# Patient Record
Sex: Male | Born: 1995 | Race: Black or African American | Hispanic: No | Marital: Single | Smoking: Current every day smoker
Health system: Southern US, Community
[De-identification: ages and names within clinical notes are randomized; demographics above are authoritative.]

---
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
## 2015-10-30 ENCOUNTER — Encounter (HOSPITAL_COMMUNITY): Payer: Self-pay | Admitting: Emergency Medicine

## 2015-10-30 ENCOUNTER — Emergency Department (HOSPITAL_COMMUNITY)
Admission: EM | Admit: 2015-10-30 | Discharge: 2015-10-30 | Disposition: A | Discharge status: Home / Self Care | Payer: Commercial Managed Care - HMO | Attending: Emergency Medicine | Admitting: Emergency Medicine

## 2015-10-30 DIAGNOSIS — R112 Nausea with vomiting, unspecified: Secondary | ICD-10-CM | POA: Diagnosis present

## 2015-10-30 DIAGNOSIS — K529 Noninfective gastroenteritis and colitis, unspecified: Secondary | ICD-10-CM | POA: Insufficient documentation

## 2015-10-30 DIAGNOSIS — F17200 Nicotine dependence, unspecified, uncomplicated: Secondary | ICD-10-CM | POA: Insufficient documentation

## 2015-10-30 DIAGNOSIS — Z79899 Other long term (current) drug therapy: Secondary | ICD-10-CM | POA: Insufficient documentation

## 2015-10-30 DIAGNOSIS — A084 Viral intestinal infection, unspecified: Secondary | ICD-10-CM | POA: Insufficient documentation

## 2015-10-30 LAB — URINALYSIS, ROUTINE W REFLEX MICROSCOPIC
Bilirubin Urine: NEGATIVE
GLUCOSE, UA: NEGATIVE mg/dL
HGB URINE DIPSTICK: NEGATIVE
Ketones, ur: NEGATIVE mg/dL
Nitrite: NEGATIVE
Protein, ur: NEGATIVE mg/dL
SPECIFIC GRAVITY, URINE: 1.037 — AB (ref 1.005–1.030)
pH: 6 (ref 5.0–8.0)

## 2015-10-30 LAB — I-STAT CHEM 8, ED
BUN: 14 mg/dL (ref 6–20)
CALCIUM ION: 1.12 mmol/L (ref 1.12–1.23)
CHLORIDE: 100 mmol/L — AB (ref 101–111)
Creatinine, Ser: 1 mg/dL (ref 0.61–1.24)
GLUCOSE: 85 mg/dL (ref 65–99)
HCT: 48 % (ref 39.0–52.0)
Hemoglobin: 16.3 g/dL (ref 13.0–17.0)
Potassium: 3.4 mmol/L — ABNORMAL LOW (ref 3.5–5.1)
Sodium: 137 mmol/L (ref 135–145)
TCO2: 26 mmol/L (ref 0–100)

## 2015-10-30 LAB — URINE MICROSCOPIC-ADD ON
RBC / HPF: NONE SEEN RBC/hpf (ref 0–5)
Squamous Epithelial / LPF: NONE SEEN

## 2015-10-30 LAB — HIV ANTIBODY (ROUTINE TESTING W REFLEX): HIV Screen 4th Generation wRfx: NONREACTIVE

## 2015-10-30 LAB — I-STAT CG4 LACTIC ACID, ED: LACTIC ACID, VENOUS: 0.87 mmol/L (ref 0.5–2.0)

## 2015-10-30 MED ORDER — AZITHROMYCIN 250 MG PO TABS
1000.0000 mg | ORAL_TABLET | Freq: Once | ORAL | Status: AC
  Administered 2015-10-30: 1000 mg via ORAL
  Filled 2015-10-30: qty 4

## 2015-10-30 MED ORDER — ONDANSETRON 8 MG PO TBDP: 8.0000 mg | ORAL_TABLET | Freq: Three times a day (TID) | ORAL | Status: DC | PRN

## 2015-10-30 MED ORDER — CEFTRIAXONE SODIUM 250 MG IJ SOLR
250.0000 mg | Freq: Once | INTRAMUSCULAR | Status: AC
  Administered 2015-10-30: 250 mg via INTRAMUSCULAR
  Filled 2015-10-30: qty 250

## 2015-10-30 MED ORDER — LOPERAMIDE HCL 2 MG PO CAPS: 2.0000 mg | ORAL_CAPSULE | Freq: Four times a day (QID) | ORAL | Status: DC | PRN

## 2015-10-30 MED ORDER — MAGNESIUM CHLORIDE 64 MG PO TBEC
1.0000 | DELAYED_RELEASE_TABLET | Freq: Once | ORAL | Status: AC
  Administered 2015-10-30: 64 mg via ORAL
  Filled 2015-10-30: qty 1

## 2015-10-30 MED ORDER — ONDANSETRON 4 MG PO TBDP
4.0000 mg | ORAL_TABLET | Freq: Once | ORAL | Status: AC
  Administered 2015-10-30: 4 mg via ORAL
  Filled 2015-10-30: qty 1

## 2015-10-30 MED ORDER — POTASSIUM CHLORIDE CRYS ER 20 MEQ PO TBCR
40.0000 meq | EXTENDED_RELEASE_TABLET | Freq: Once | ORAL | Status: AC
  Administered 2015-10-30: 40 meq via ORAL
  Filled 2015-10-30: qty 2

## 2015-10-30 MED ORDER — LIDOCAINE HCL (PF) 1 % IJ SOLN
INTRAMUSCULAR | Status: AC
  Administered 2015-10-30: 2 mL
  Filled 2015-10-30: qty 5

## 2015-10-30 NOTE — ED Provider Notes (Signed)
CSN: [id number]     Arrival date & time 10/30/15  0320 History  By signing my name below, I, Terrance Branch, attest that this documentation has been prepared under the direction and in the presence of Derwood KaplanAnkit Jemaine Prokop, MD. Electronically Signed: Evon Slackerrance Branch, ED Scribe. 10/30/2015. 6:44 AM.    Chief Complaint  Patient presents with  . Nausea  . Emesis  . Diarrhea    Patient is a 19 y.o. male presenting with vomiting and diarrhea. The history is provided by the patient. No language interpreter was used.  Emesis Associated symptoms: abdominal pain and diarrhea   Diarrhea Associated symptoms: abdominal pain and vomiting   Associated symptoms: no fever    HPI Comments: Douglas Kline is a 19 y.o. male who presents to the Emergency Department complaining of new abdominal pain onset 4 days prior. Pt states that he has associated nausea, vomiting and diarrhea. Pt reports watery stool almost every hour. Pt states that yesterday his diarrhea was at its worse, he states that during the night he would wake up in the middle of the night after defecating on himself. Pt reports being around his mom and sister who has had similar symptoms. Pt doesn't report any suspect food intake. Pt denies blood in his stool, fever, dizziness, light headedness or rash. Pt denies any recent travel.   History reviewed. No pertinent past medical history. History reviewed. No pertinent past surgical history. History reviewed. No pertinent family history. Social History  Substance Use Topics  . Smoking status: Current Every Day Smoker  . Smokeless tobacco: None  . Alcohol Use: No    Review of Systems  Constitutional: Negative for fever.  Gastrointestinal: Positive for nausea, vomiting, abdominal pain and diarrhea. Negative for blood in stool.  Neurological: Negative for dizziness and light-headedness.    ROS 10 Systems reviewed and are negative for acute change except as noted in the HPI.     Allergies   Morphine and related  Home Medications   Prior to Admission medications   Medication Sig Start Date End Date Taking? Authorizing Provider  acetaminophen (TYLENOL) 500 MG tablet Take 1,000 mg by mouth every 6 (six) hours as needed for moderate pain.   Yes Historical Provider, MD  bismuth subsalicylate (PEPTO BISMOL) 262 MG chewable tablet Chew 1,048 mg by mouth 3 (three) times daily as needed for indigestion.   Yes Historical Provider, MD  loperamide (IMODIUM) 2 MG capsule Take 1 capsule (2 mg total) by mouth 4 (four) times daily as needed for diarrhea or loose stools. 10/30/15   Derwood KaplanAnkit Jacqueline Spofford, MD  ondansetron (ZOFRAN ODT) 8 MG disintegrating tablet Take 1 tablet (8 mg total) by mouth every 8 (eight) hours as needed for nausea. 10/30/15   Flo Berroa, MD   BP 130/76 mmHg  Pulse 68  Temp(Src) 98.5 F (36.9 C) (Oral)  Resp 18  SpO2 99%   Physical Exam  Constitutional: He is oriented to person, place, and time. He appears well-developed and well-nourished. No distress.  HENT:  Head: Normocephalic and atraumatic.  Mouth/Throat: Mucous membranes are normal.  Mucous membranes moist.   Eyes: Conjunctivae and EOM are normal.  Neck: Neck supple. No tracheal deviation present.  Cardiovascular: Normal rate and regular rhythm.   Pulmonary/Chest: Effort normal and breath sounds normal. No respiratory distress.  Lungs clear to auscultation.   Abdominal: There is tenderness in the periumbilical area.  Periumbilical tenderness without peritoneal signs.   Musculoskeletal: Normal range of motion.  Neurological: He is alert and  oriented to person, place, and time.  Skin: Skin is warm and dry.  Psychiatric: He has a normal mood and affect. His behavior is normal.  Nursing note and vitals reviewed.   ED Course  Procedures (including critical care time) DIAGNOSTIC STUDIES: Oxygen Saturation is 99% on RA, normal by my interpretation.    COORDINATION OF CARE: 3:47 AM-Discussed treatment plan  with pt at bedside and pt agreed to plan.   :20 am: Pt alerts [place] that he had some penile discharge last month, which resolved, but then he had some discomfort with urination today. He admits to unprotected intercourse with 1 partner. Would prefer GC and Chlamydia treatment. Would prefer HIV screen Oral challenge has been initiated.   Labs Review Labs Reviewed  URINALYSIS, ROUTINE W REFLEX MICROSCOPIC (NOT AT [facility]) - Abnormal; Notable for the following:    Color, Urine AMBER (*)    APPearance CLOUDY (*)    Specific Gravity, Urine 1.037 (*)    Leukocytes, UA TRACE (*)    All other components within normal limits  URINE MICROSCOPIC-ADD ON - Abnormal; Notable for the following:    Bacteria, UA RARE (*)    Crystals CA OXALATE CRYSTALS (*)    All other components within normal limits  I-STAT CHEM 8, ED - Abnormal; Notable for the following:    Potassium 3.4 (*)    Chloride 100 (*)    All other components within normal limits  HIV ANTIBODY (ROUTINE TESTING)  I-STAT CG4 LACTIC ACID, ED  GC/CHLAMYDIA PROBE AMP ([employer]) NOT AT [facility]    Imaging Review No results found.    EKG Interpretation None      MDM   Final diagnoses:  Viral gastroenteritis  Colitis     I personally performed the services described in this documentation, which was scribed in my presence. The recorded information has been reviewed and is accurate.   Pt comes in with cc of abd pain, n/v/d. Diarrhea is moderately severe, although seems like he did better today compared to yday. Abd pain is worse today. Pain is still non focal and non peritoneal.  Will get hydration going. With 10+ episodes of emesis, requested RN to order chem 8.   :30 pm: PO challenge passed.   Derwood Kaplan, MD 10/30/15 [phone]

## 2015-10-30 NOTE — ED Notes (Signed)
Patient here with complaints of abd pain, nausea, vomiting, dirrhea x3 days.

## 2015-10-30 NOTE — Discharge Instructions (Signed)
Clear Liquid Diet A clear liquid diet is a short-term diet that is prescribed to provide the necessary fluid and basic energy you need when you can have nothing else. The clear liquid diet consists of liquids or solids that will become liquid at room temperature. You should be able to see through the liquid. There are many reasons that you may be restricted to clear liquids, such as:  When you have a sudden-onset (acute) condition that occurs before or after surgery.  To help your body slowly get adjusted to food again after a long period when you were unable to have food.  Replacement of fluids when you have a diarrheal disease.  When you are going to have certain exams, such as a colonoscopy, in which instruments are inserted inside your body to look at parts of your digestive system. WHAT CAN I HAVE? A clear liquid diet does not provide all the nutrients you need. It is important to choose a variety of the following items to get as many nutrients as possible:  Vegetable juices that do not have pulp.  Fruit juices and fruit drinks that do not have pulp.  Coffee (regular or decaffeinated), tea, or soda at the discretion of your health care provider.  Clear bouillon, broth, or strained broth-based soups.  High-protein and flavored gelatins.  Sugar or honey.  Ices or frozen ice pops that do not contain milk. If you are not sure whether you can have certain items, you should ask your health care provider. You may also ask your health care provider if there are any other clear liquid options.   This information is not intended to replace advice given to you by your health care provider. Make sure you discuss any questions you have with your health care provider.   Document Released: 10/30/2005 Document Revised: 11/04/2013 Document Reviewed: 09/26/2013 Elsevier Interactive Patient Education 2016 [employer]    Viral Gastroenteritis Viral gastroenteritis is also called stomach flu.  This illness is caused by a certain type of germ (virus). It can cause sudden watery poop (diarrhea) and throwing up (vomiting). This can cause you to lose body fluids (dehydration). This illness usually lasts for 3 to 8 days. It usually goes away on its own. HOME CARE   Drink enough fluids to keep your pee (urine) clear or pale yellow. Drink small amounts of fluids often.  Ask your doctor how to replace body fluid losses (rehydration).  Avoid:  Foods high in sugar.  Alcohol.  Bubbly (carbonated) drinks.  Tobacco.  Juice.  Caffeine drinks.  Very hot or cold fluids.  Fatty, greasy foods.  Eating too much at one time.  Dairy products until 24 to 48 hours after your watery poop stops.  You may eat foods with active cultures (probiotics). They can be found in some yogurts and supplements.  Wash your hands well to avoid spreading the illness.  Only take medicines as told by your doctor. Do not give aspirin to children. Do not take medicines for watery poop (antidiarrheals).  Ask your doctor if you should keep taking your regular medicines.  Keep all doctor visits as told. GET HELP RIGHT AWAY IF:   You cannot keep fluids down.  You do not pee at least once every 6 to 8 hours.  You are short of breath.  You see blood in your poop or throw up. This may look like coffee grounds.  You have belly (abdominal) pain that gets worse or is just in one small spot (  localized).  You keep throwing up or having watery poop.  You have a fever.  The patient is a child younger than 3 months, and he or she has a fever.  The patient is a child older than 3 months, and he or she has a fever and problems that do not go away.  The patient is a child older than 3 months, and he or she has a fever and problems that suddenly get worse.  The patient is a baby, and he or she has no tears when crying. MAKE SURE YOU:   Understand these instructions.  Will watch your condition.  Will  get help right away if you are not doing well or get worse.   This information is not intended to replace advice given to you by your health care provider. Make sure you discuss any questions you have with your health care provider.   Document Released: 04/17/2008 Document Revised: 01/22/2012 Document Reviewed: 08/16/2011 Elsevier Interactive Patient Education [employer] Else[employer] Inc.

## 2015-11-01 LAB — GC/CHLAMYDIA PROBE AMP (~~LOC~~) NOT AT ARMC
Chlamydia: POSITIVE — AB
NEISSERIA GONORRHEA: NEGATIVE

## 2015-11-02 ENCOUNTER — Telehealth: Payer: Self-pay | Admitting: Emergency Medicine

## 2015-11-02 ENCOUNTER — Telehealth (HOSPITAL_BASED_OUTPATIENT_CLINIC_OR_DEPARTMENT_OTHER): Payer: Self-pay | Admitting: Emergency Medicine

## 2015-11-03 ENCOUNTER — Telehealth (HOSPITAL_COMMUNITY): Payer: Self-pay

## 2015-11-03 NOTE — Telephone Encounter (Signed)
Pt ID verified x 2.  Pt informed of dx (chlamydia), tx rcvd approp., notify partner and abstain from sex x 2 weeks from tx date.

## 2020-01-19 ENCOUNTER — Emergency Department (HOSPITAL_BASED_OUTPATIENT_CLINIC_OR_DEPARTMENT_OTHER)
Admission: EM | Admit: 2020-01-19 | Discharge: 2020-01-19 | Discharge status: Against Medical Advice | Payer: 59 | Attending: Emergency Medicine | Admitting: Emergency Medicine

## 2020-01-19 ENCOUNTER — Other Ambulatory Visit: Payer: Self-pay

## 2020-01-19 ENCOUNTER — Encounter (HOSPITAL_BASED_OUTPATIENT_CLINIC_OR_DEPARTMENT_OTHER): Payer: Self-pay | Admitting: *Deleted

## 2020-01-19 DIAGNOSIS — Z5321 Procedure and treatment not carried out due to patient leaving prior to being seen by health care provider: Secondary | ICD-10-CM | POA: Diagnosis not present

## 2020-01-19 DIAGNOSIS — Z202 Contact with and (suspected) exposure to infections with a predominantly sexual mode of transmission: Secondary | ICD-10-CM | POA: Diagnosis present

## 2020-01-19 NOTE — ED Notes (Signed)
Called for room no answer

## 2020-01-19 NOTE — ED Notes (Signed)
Called for room with no answer.  °

## 2020-01-19 NOTE — ED Triage Notes (Signed)
Possible STD exposure. He has blisters on his penis.

## 2020-01-21 LAB — GC/CHLAMYDIA PROBE AMP (~~LOC~~) NOT AT ARMC
Chlamydia: NEGATIVE
Neisseria Gonorrhea: NEGATIVE

## 2020-09-06 ENCOUNTER — Other Ambulatory Visit: Payer: Self-pay

## 2020-09-06 ENCOUNTER — Emergency Department (HOSPITAL_COMMUNITY)
Admission: EM | Admit: 2020-09-06 | Discharge: 2020-09-06 | Disposition: A | Discharge status: Home / Self Care | Payer: 59 | Attending: Emergency Medicine | Admitting: Emergency Medicine

## 2020-09-06 ENCOUNTER — Encounter (HOSPITAL_COMMUNITY): Payer: Self-pay

## 2020-09-06 DIAGNOSIS — R369 Urethral discharge, unspecified: Secondary | ICD-10-CM | POA: Diagnosis present

## 2020-09-06 DIAGNOSIS — N4889 Other specified disorders of penis: Secondary | ICD-10-CM | POA: Insufficient documentation

## 2020-09-06 DIAGNOSIS — F17290 Nicotine dependence, other tobacco product, uncomplicated: Secondary | ICD-10-CM | POA: Diagnosis not present

## 2020-09-06 LAB — URINALYSIS, ROUTINE W REFLEX MICROSCOPIC
Bilirubin Urine: NEGATIVE
Glucose, UA: NEGATIVE mg/dL
Hgb urine dipstick: NEGATIVE
Ketones, ur: NEGATIVE mg/dL
Nitrite: NEGATIVE
Protein, ur: NEGATIVE mg/dL
Specific Gravity, Urine: 1.017 (ref 1.005–1.030)
WBC, UA: >50 WBC/hpf — ABNORMAL HIGH (ref 0–5)
pH: 7 (ref 5.0–8.0)

## 2020-09-06 MED ORDER — CEFTRIAXONE SODIUM 1 G IJ SOLR
500.0000 mg | Freq: Once | INTRAMUSCULAR | Status: AC
  Administered 2020-09-06: 500 mg via INTRAMUSCULAR
  Filled 2020-09-06: qty 10

## 2020-09-06 MED ORDER — STERILE WATER FOR INJECTION IJ SOLN
INTRAMUSCULAR | Status: AC
  Administered 2020-09-06: 10 mL
  Filled 2020-09-06: qty 10

## 2020-09-06 MED ORDER — DOXYCYCLINE HYCLATE 100 MG PO CAPS: 100.0000 mg | ORAL_CAPSULE | Freq: Two times a day (BID) | ORAL | 0 refills | Status: AC

## 2020-09-06 MED ORDER — DOXYCYCLINE HYCLATE 100 MG PO TABS
100.0000 mg | ORAL_TABLET | Freq: Once | ORAL | Status: AC
  Administered 2020-09-06: 100 mg via ORAL
  Filled 2020-09-06: qty 1

## 2020-09-06 NOTE — Discharge Instructions (Signed)
Please take a few minutes to read these important discharge instructions below: You have been diagnosed with and treated for a presumed sexually transmitted infection such as chlamydia, gonorrhea, or trichomoniasis.  Chlamydia and gonorrhea are the most commonly reported communicable diseases in the [place]. They are especially common among people younger than 25 years of age. Infection without symptoms is common in men and women. Your partner may be infected but not display symptoms.  Complications of these infections without treatment include serious pelvic infections, ectopic pregnancy, and infertility. Other infections such as HIV or HPV, which can occur in patients with sexually transmitted infections, can lead to cancer or death. To minimize the spread of the infection, you should avoid sexual intercourse for 7 days or until symptoms have resolved.  To minimize the spread of the infection, you should advise your sexual partner(s) to be evaluated, tested and treated. This includes all sexual partners within the past 60 days or your last sexual partner if last contact was greater than 60 days.  To minimize the risk of reinfection, you should abstain from sexual intercourse until your sexual partners have been tested and treated.  Consistent condom use is important in preventing the spread of sexually transmitted infections.  Do you need to get re-tested? Studies reveal that infection with chlamydia or gonorrhea is common among those treated within the preceding several months. Testing for gonorrhea and chlamydia in approximately 3 months is recommended even if you believe your sexual partner has been treated. Based on your history or demographics, your doctor may feel that you are higher risk for HIV and syphilis.  Talk to your doctor about whether you should be tested for these diseases at this time.  When should you come back to the Emergency Department? If your symptoms have not  improved after 48 hours from when you received your antibiotics, you should contact your primary care doctor or return to the Emergency Department.  These may be signs of an infection that has not been fully treated. If you develop worsening pain, worsening discharge from your penis or vagina, fevers of greater than 100.4 F, or have any other new or alarming symptoms, you should return promptly to the Emergency Department.  You should follow up with your primary care doctor's office in 1 week to ensure that your symptoms have resolved.  

## 2020-09-06 NOTE — ED Triage Notes (Signed)
Patient c/o right groin and testicle swelling x 2 days. patient states he noticed it after working out.

## 2020-09-06 NOTE — ED Provider Notes (Signed)
[facility]PT Provider Note   CSN: [id number] Arrival date & time: 09/06/20  1009     History CC: Penile drainage   Douglas Kline is a 24 y.o. male presented to emergency department with penile drainage.  The patient reports that he is noted white discharge from the penis for the past 2 to 3 days.  He said that he felt like his testicles were "feeling weird" but denies any active pain in his testicles.  He denies any swelling or trauma to the testicles.  He says he saw his PCP for similar symptoms several months ago, was told that he might have herpes, and started on an antiviral, but never treated with antibiotics.  He says he was tested for sexual diseases, but he never received his test results.  He has not been sexually active since then.  Chart review shows GC/Chlamydia from 01/19/20 negative  Allergies to morphine No other medical problems  HPI     History reviewed. No pertinent past medical history.  There are no problems to display for this patient.   History reviewed. No pertinent surgical history.     Family History  Problem Relation Age of Onset  . Lupus Mother   . Hypertension Father     Social History   Tobacco Use  . Smoking status: Current Every Day Smoker    Packs/day: 1.00    Types: Cigars  . Smokeless tobacco: Never Used  Vaping Use  . Vaping Use: Never used  Substance Use Topics  . Alcohol use: Yes  . Drug use: Yes    Types: Marijuana    Home Medications Prior to Admission medications   Medication Sig Start Date End Date Taking? Authorizing Provider  acetaminophen (TYLENOL) 500 MG tablet Take 1,000 mg by mouth every 6 (six) hours as needed for moderate pain.   Yes [provider]  acyclovir (ZOVIRAX) 800 MG tablet Take 400-800 mg by mouth daily.   Yes [provider]  bismuth subsalicylate (PEPTO BISMOL) 262 MG chewable tablet Chew 1,048 mg by mouth 3 (three) times daily as needed for  indigestion.   Yes [provider]  doxycycline (VIBRAMYCIN) 100 MG capsule Take 1 capsule (100 mg total) by mouth 2 (two) times daily for 7 days. 09/06/20 09/13/20  Terald Sleeper, MD  loperamide (IMODIUM) 2 MG capsule Take 1 capsule (2 mg total) by mouth 4 (four) times daily as needed for diarrhea or loose stools. Patient not taking: Reported on 09/06/2020 10/30/15   Derwood Kaplan, MD  ondansetron (ZOFRAN ODT) 8 MG disintegrating tablet Take 1 tablet (8 mg total) by mouth every 8 (eight) hours as needed for nausea. Patient not taking: Reported on 09/06/2020 10/30/15   Derwood Kaplan, MD    Allergies    Morphine and related  Review of Systems   Review of Systems  Constitutional: Negative for chills and fever.  Respiratory: Negative for cough and shortness of breath.   Cardiovascular: Negative for chest pain and palpitations.  Gastrointestinal: Negative for abdominal pain and vomiting.  Genitourinary: Positive for discharge and scrotal swelling. Negative for difficulty urinating, dysuria, hematuria and testicular pain.  Musculoskeletal: Negative for arthralgias and back pain.  Skin: Negative for rash and wound.  Neurological: Negative for syncope and headaches.  Psychiatric/Behavioral: Negative for agitation and confusion.  All other systems reviewed and are negative.   Physical Exam Updated Vital Signs BP 114/70 (BP Location: Left Arm)   Pulse 83   Temp 98.2 F (  36.8 C) (Oral)   Resp 14   Ht 5\' 6"  (1.676 m)   Wt 61.2 kg   SpO2 100%   BMI 21.79 kg/m   Physical Exam Vitals and nursing note reviewed. Exam conducted with a chaperone present.  Constitutional:      Appearance: He is well-developed.  HENT:     Head: Normocephalic and atraumatic.  Eyes:     Conjunctiva/sclera: Conjunctivae normal.  Cardiovascular:     Rate and Rhythm: Normal rate and regular rhythm.     Pulses: Normal pulses.  Pulmonary:     Effort: Pulmonary effort is normal. No respiratory  distress.  Abdominal:     Palpations: Abdomen is soft.     Tenderness: There is no abdominal tenderness.  Genitourinary:    Penis: Normal and circumcised.      Testes: Normal. Cremasteric reflex is present.        Right: Mass, tenderness, swelling, testicular hydrocele or varicocele not present.        Left: Mass, tenderness, swelling, testicular hydrocele or varicocele not present.     Epididymis:     Right: Normal.     Left: Normal.     Comments: Small amount of clear drainage from meatus Musculoskeletal:     Cervical back: Neck supple.  Skin:    General: Skin is warm and dry.  Neurological:     General: No focal deficit present.     Mental Status: He is alert and oriented to person, place, and time.  Psychiatric:        Mood and Affect: Mood normal.        Behavior: Behavior normal.     ED Results / Procedures / Treatments   Labs (all labs ordered are listed, but only abnormal results are displayed) Labs Reviewed  URINALYSIS, ROUTINE W REFLEX MICROSCOPIC - Abnormal; Notable for the following components:      Result Value   Leukocytes,Ua TRACE (*)    WBC, UA >50 (*)    Bacteria, UA RARE (*)    All other components within normal limits  GC/CHLAMYDIA PROBE AMP ([place]) NOT AT [facility]    EKG None  Radiology No results found.  Procedures Procedures (including critical care time)  Medications Ordered in ED Medications  cefTRIAXone (ROCEPHIN) injection 500 mg (500 mg Intramuscular Given 09/06/20 1115)  doxycycline (VIBRA-TABS) tablet 100 mg (100 mg Oral Given 09/06/20 1113)  sterile water (preservative free) injection (10 mLs  Given 09/06/20 1115)    ED Course  I have reviewed the triage vital signs and the nursing notes.  Pertinent labs & imaging results that were available during my care of the patient were reviewed by me and considered in my medical decision making (see chart for details).  This patient presents to the Emergency Department with complaint  of exposure or symptoms of sexually transmitted or pelvic infection.   The patient's clinical exam was most consistent with STI given his clear penile discharge.  No evidence of systemic involvement.  No risk factors for syphilis.  No ulcerated lesions.  No testicular pain, swelling or tenderness - doubt torsion or epididymitis.    Treated empirically for STI with rocephin and doxycycline here - 7 days of doxy after this.    Doubt this is herpes zoster clinically.  I discussed outpatient follow up with primary care provider, and advised follow up on their culture results, if sent.  I also advised sexual abstinence while awaiting test results, and encouraging all prior and  current sexual partners to obtain testing and treatment if positive for STI on today's workup.   Return precautions were discussed with the patient.  I felt the patient was clinically stable for discharge.   Final Clinical Impression(s) / ED Diagnoses Final diagnoses:  Drainage from penis    Rx / DC Orders ED Discharge Orders         Ordered    doxycycline (VIBRAMYCIN) 100 MG capsule  2 times daily        09/06/20 1109           Terald Sleeper, MD 09/06/20 1515

## 2020-09-07 LAB — GC/CHLAMYDIA PROBE AMP (~~LOC~~) NOT AT ARMC
Chlamydia: NEGATIVE
Comment: NEGATIVE
Comment: NORMAL
Neisseria Gonorrhea: POSITIVE — AB

## 2022-03-10 ENCOUNTER — Other Ambulatory Visit: Payer: Self-pay

## 2022-03-10 ENCOUNTER — Encounter (HOSPITAL_COMMUNITY): Payer: Self-pay

## 2022-03-10 ENCOUNTER — Emergency Department (HOSPITAL_COMMUNITY)
Admission: EM | Admit: 2022-03-10 | Discharge: 2022-03-10 | Disposition: A | Discharge status: Home / Self Care | Payer: 59 | Attending: Emergency Medicine | Admitting: Emergency Medicine

## 2022-03-10 DIAGNOSIS — N489 Disorder of penis, unspecified: Secondary | ICD-10-CM | POA: Diagnosis present

## 2022-03-10 DIAGNOSIS — B009 Herpesviral infection, unspecified: Secondary | ICD-10-CM | POA: Insufficient documentation

## 2022-03-10 MED ORDER — VALACYCLOVIR HCL 1 G PO TABS: 500.0000 mg | ORAL_TABLET | Freq: Two times a day (BID) | ORAL | 0 refills | Status: AC

## 2022-03-10 NOTE — ED Provider Notes (Signed)
?[facility]-EMER[facility] DEPT ?Provider Note ? ? ?CSN: [id number] ?Arrival date & time: 03/10/22  1019 ? ?  ? ?History ?No chief complaint on file. ? ? ?Douglas Kline is a 26 y.o. male with a past medical history of HSV presenting today due to herpes outbreak.  Reports that last night he started to have some lesions on his penis.  Says they are not painful at this time.  Called his PCP who said that he did not have any appointments for the next 4 weeks and to go to the emergency department.  Denies dysuria, hematuria.  Has not had any new sexual partners in the past couple years.  No penile or testicular pain. ? ?  ? ?Home Medications ?Prior to Admission medications   ?Medication Sig Start Date End Date Taking? Authorizing Provider  ?acetaminophen (TYLENOL) 500 MG tablet Take 1,000 mg by mouth every 6 (six) hours as needed for moderate pain.    [provider]  ?acyclovir (ZOVIRAX) 800 MG tablet Take 400-800 mg by mouth daily.    [provider]  ?bismuth subsalicylate (PEPTO BISMOL) 262 MG chewable tablet Chew 1,048 mg by mouth 3 (three) times daily as needed for indigestion.    [provider]  ?loperamide (IMODIUM) 2 MG capsule Take 1 capsule (2 mg total) by mouth 4 (four) times daily as needed for diarrhea or loose stools. ?Patient not taking: Reported on 09/06/2020 10/30/15   Derwood Kaplan, MD  ?ondansetron (ZOFRAN ODT) 8 MG disintegrating tablet Take 1 tablet (8 mg total) by mouth every 8 (eight) hours as needed for nausea. ?Patient not taking: Reported on 09/06/2020 10/30/15   Derwood Kaplan, MD  ?   ? ?Allergies    ?Morphine and related   ? ?Review of Systems   ?Review of Systems ? ?Physical Exam ?Updated Vital Signs ?BP (!) 120/101 (BP Location: Right Arm)   Pulse 77   Temp 98.1 ?F (36.7 ?C) (Oral)   Resp 16   Ht 5\' 7"  (1.702 m)   Wt 65.8 kg   SpO2 100%   BMI 22.71 kg/m?  ?Physical Exam ?Vitals and nursing note reviewed.  ?Constitutional:   ?    Appearance: Normal appearance.  ?HENT:  ?   Head: Normocephalic and atraumatic.  ?Eyes:  ?   General: No scleral icterus. ?   Conjunctiva/sclera: Conjunctivae normal.  ?Pulmonary:  ?   Effort: Pulmonary effort is normal. No respiratory distress.  ?Genitourinary: ?   Testes: Normal.  ?   Comments: GU exam performed in the presence of EMT.  Patient with 6-7 vesicular lesions to the right shaft.  None weeping.  Nontender to palpation ?Skin: ?   Findings: No rash.  ?Neurological:  ?   Mental Status: He is alert.  ?Psychiatric:     ?   Mood and Affect: Mood normal.  ? ? ?ED Results / Procedures / Treatments   ?Labs ?(all labs ordered are listed, but only abnormal results are displayed) ?Labs Reviewed  ?GC/CHLAMYDIA PROBE AMP ([place]) NOT AT [facility]  ? ? ?EKG ?None ? ?Radiology ?No results found. ? ?Procedures ?Procedures  ? ?Medications Ordered in ED ?Medications - No data to display ? ?ED Course/ Medical Decision Making/ A&P ?  ?                        ?Medical Decision Making ? ?26 year old male presenting due to herpes outbreak.  Contacted his PCP who said that there  would be no appointments within the next couple weeks.  Says he noticed the outbreak last night.  Denies pain or other STD symptoms. ? ?Because patient is here, he will be tested for chlamydia and gonorrhea.  I will send valacyclovir to his pharmacy as requested. ? ?Final Clinical Impression(s) / ED Diagnoses ?Final diagnoses:  ?HSV (herpes simplex virus) infection  ? ? ?Rx / DC Orders ?ED Discharge Orders   ? ?      Ordered  ?  valACYclovir (VALTREX) 1000 MG tablet  2 times daily       ? 03/10/22 1235  ? ?  ?  ? ?  ? ?Results and diagnoses were explained to the patient. Return precautions discussed in full. Patient had no additional questions and expressed complete understanding. ? ? ?This chart was dictated using voice recognition software.  Despite best efforts to proofread,  errors can occur which can change the documentation meaning.  ? ?   ?Saddie Benders, PA-C ?03/10/22 1236 ? ?  ?Rolan Bucco, MD ?03/10/22 1422 ? ?

## 2022-03-10 NOTE — ED Triage Notes (Signed)
Patient states he has open sores on his penis since yesterday. Patient states he already knows he has an STD and just wants medication. ?

## 2022-03-10 NOTE — Discharge Instructions (Signed)
Your valacyclovir is at the pharmacy. ?

## 2022-03-10 NOTE — ED Notes (Signed)
Patient reports history of herpes, stating he is out of his medication for outbreak. ?

## 2022-03-14 LAB — GC/CHLAMYDIA PROBE AMP (~~LOC~~) NOT AT ARMC
Chlamydia: POSITIVE — AB
Comment: NEGATIVE
Comment: NORMAL
Neisseria Gonorrhea: NEGATIVE

## 2022-05-21 ENCOUNTER — Ambulatory Visit (HOSPITAL_COMMUNITY)
Admission: EM | Admit: 2022-05-21 | Discharge: 2022-05-21 | Disposition: A | Discharge status: Home / Self Care | Payer: 59 | Attending: Emergency Medicine | Admitting: Emergency Medicine

## 2022-05-21 ENCOUNTER — Encounter (HOSPITAL_COMMUNITY): Payer: Self-pay | Admitting: Emergency Medicine

## 2022-05-21 DIAGNOSIS — Z202 Contact with and (suspected) exposure to infections with a predominantly sexual mode of transmission: Secondary | ICD-10-CM | POA: Insufficient documentation

## 2022-05-21 DIAGNOSIS — Z113 Encounter for screening for infections with a predominantly sexual mode of transmission: Secondary | ICD-10-CM | POA: Diagnosis present

## 2022-05-21 MED ORDER — DOXYCYCLINE HYCLATE 100 MG PO CAPS: 100.0000 mg | ORAL_CAPSULE | Freq: Two times a day (BID) | ORAL | 0 refills | Status: DC

## 2022-05-21 NOTE — ED Triage Notes (Signed)
Pt presents to UC for routine STI check. Denies any current symptoms. Reports possible exposure to Chlamydia.

## 2022-05-21 NOTE — ED Provider Notes (Signed)
[facility]    CSN: [id number] Arrival date & time: 05/21/22  1659      History   Chief Complaint Chief Complaint  Patient presents with   Exposure to STD    HPI Douglas Kline is a 26 y.o. male.   Patient presents requesting STI testing after exposure to chlamydia.  Denies all symptoms.  Sexually active, 1 male, no condom use.  History reviewed. No pertinent past medical history.  There are no problems to display for this patient.   History reviewed. No pertinent surgical history.     Home Medications    Prior to Admission medications   Medication Sig Start Date End Date Taking? Authorizing Provider  doxycycline (VIBRAMYCIN) 100 MG capsule Take 1 capsule (100 mg total) by mouth 2 (two) times daily. 05/21/22  Yes Dorri Ozturk, Elita Boone, NP  acetaminophen (TYLENOL) 500 MG tablet Take 1,000 mg by mouth every 6 (six) hours as needed for moderate pain.    [provider]  acyclovir (ZOVIRAX) 800 MG tablet Take 400-800 mg by mouth daily.    [provider]  bismuth subsalicylate (PEPTO BISMOL) 262 MG chewable tablet Chew 1,048 mg by mouth 3 (three) times daily as needed for indigestion.    [provider]  loperamide (IMODIUM) 2 MG capsule Take 1 capsule (2 mg total) by mouth 4 (four) times daily as needed for diarrhea or loose stools. Patient not taking: Reported on 09/06/2020 10/30/15   Derwood Kaplan, MD  ondansetron (ZOFRAN ODT) 8 MG disintegrating tablet Take 1 tablet (8 mg total) by mouth every 8 (eight) hours as needed for nausea. Patient not taking: Reported on 09/06/2020 10/30/15   Derwood Kaplan, MD    Family History Family History  Problem Relation Age of Onset   Lupus Mother    Hypertension Father     Social History Social History   Tobacco Use   Smoking status: Every Day    Types: Cigars   Smokeless tobacco: Never  Vaping Use   Vaping Use: Some days   Substances: Nicotine, Flavoring  Substance Use Topics    Alcohol use: Yes   Drug use: Yes    Types: Marijuana     Allergies   Morphine and related   Review of Systems Review of Systems  Constitutional: Negative.   Respiratory: Negative.    Cardiovascular: Negative.   Genitourinary: Negative.   Skin: Negative.   Neurological: Negative.      Physical Exam Triage Vital Signs ED Triage Vitals  Enc Vitals Group     BP 05/21/22 1739 138/87     Pulse Rate 05/21/22 1739 72     Resp 05/21/22 1739 16     Temp 05/21/22 1739 98.4 F (36.9 C)     Temp Source 05/21/22 1739 Oral     SpO2 05/21/22 1739 100 %     Weight --      Height --      Head Circumference --      Peak Flow --      Pain Score 05/21/22 1738 0     Pain Loc --      Pain Edu? --      Excl. in GC? --    No data found.  Updated Vital Signs BP 138/87 (BP Location: Right Arm)   Pulse 72   Temp 98.4 F (36.9 C) (Oral)   Resp 16   SpO2 100%   Visual Acuity Right Eye Distance:   Left Eye Distance:  Bilateral Distance:    Right Eye Near:   Left Eye Near:    Bilateral Near:     Physical Exam Constitutional:      Appearance: Normal appearance.  HENT:     Head: Normocephalic.  Eyes:     Extraocular Movements: Extraocular movements intact.  Pulmonary:     Effort: Pulmonary effort is normal.  Genitourinary:    Comments: deferred Neurological:     Mental Status: He is alert and oriented to person, place, and time. Mental status is at baseline.  Psychiatric:        Mood and Affect: Mood normal.        Behavior: Behavior normal.      UC Treatments / Results  Labs (all labs ordered are listed, but only abnormal results are displayed) Labs Reviewed - No data to display  EKG   Radiology No results found.  Procedures Procedures (including critical care time)  Medications Ordered in UC Medications - No data to display  Initial Impression / Assessment and Plan / UC Course  I have reviewed the triage vital signs and the nursing  notes.  Pertinent labs & imaging results that were available during my care of the patient were reviewed by me and considered in my medical decision making (see chart for details).  Routine screening for STI, exposure to chlamydia  We will treat prophylactically, doxycycline 7-day course prescribed, STI labs are pending, declined HIV and syphilis testing, advised abstinence until all labs are resulted, treatment is complete, advised condom use during all sexual encounters moving forward, may follow-up with his urgent care as needed  Final Clinical Impressions(s) / UC Diagnoses   Final diagnoses:  Routine screening for STI (sexually transmitted infection)  Exposure to chlamydia     Discharge Instructions      Today we will prophylactically treat you for chlamydia, take doxycycline every morning and every evening for the next 7 days  Labs pending 2-3 days, you will be contacted if positive for any sti and treatment will be sent to the pharmacy, you will have to return to the clinic if positive for gonorrhea to receive treatment   Please refrain from having sex until labs results, if positive please refrain from having sex until treatment complete and symptoms resolve   If positive for , Chlamydia  gonorrhea or trichomoniasis please notify partner or partners so they may tested as well  Moving forward, it is recommended you use some form of protection against the transmission of sti infections  such as condoms or dental dams with each sexual encounter     ED Prescriptions     Medication Sig Dispense Auth. Provider   doxycycline (VIBRAMYCIN) 100 MG capsule Take 1 capsule (100 mg total) by mouth 2 (two) times daily. 20 capsule Valinda Hoar, NP      PDMP not reviewed this encounter.   Valinda Hoar, [place] 05/24/22 [phone]

## 2022-05-21 NOTE — Discharge Instructions (Addendum)
Today we will prophylactically treat you for chlamydia, take doxycycline every morning and every evening for the next 7 days  Labs pending 2-3 days, you will be contacted if positive for any sti and treatment will be sent to the pharmacy, you will have to return to the clinic if positive for gonorrhea to receive treatment   Please refrain from having sex until labs results, if positive please refrain from having sex until treatment complete and symptoms resolve   If positive for  Chlamydia  gonorrhea or trichomoniasis please notify partner or partners so they may tested as well  Moving forward, it is recommended you use some form of protection against the transmission of sti infections  such as condoms or dental dams with each sexual encounter

## 2022-05-22 LAB — CYTOLOGY, (ORAL, ANAL, URETHRAL) ANCILLARY ONLY
Chlamydia: POSITIVE — AB
Comment: NEGATIVE
Comment: NEGATIVE
Comment: NORMAL
Neisseria Gonorrhea: NEGATIVE
Trichomonas: NEGATIVE

## 2022-11-04 ENCOUNTER — Ambulatory Visit (HOSPITAL_COMMUNITY)
Admission: EM | Admit: 2022-11-04 | Discharge: 2022-11-04 | Disposition: A | Discharge status: Home / Self Care | Payer: 59 | Attending: Emergency Medicine | Admitting: Emergency Medicine

## 2022-11-04 ENCOUNTER — Encounter (HOSPITAL_COMMUNITY): Payer: Self-pay | Admitting: Emergency Medicine

## 2022-11-04 DIAGNOSIS — Z113 Encounter for screening for infections with a predominantly sexual mode of transmission: Secondary | ICD-10-CM | POA: Diagnosis not present

## 2022-11-04 DIAGNOSIS — B009 Herpesviral infection, unspecified: Secondary | ICD-10-CM | POA: Insufficient documentation

## 2022-11-04 MED ORDER — VALACYCLOVIR HCL 1 G PO TABS: 1000.0000 mg | ORAL_TABLET | Freq: Three times a day (TID) | ORAL | 1 refills | Status: AC

## 2022-11-04 NOTE — ED Triage Notes (Addendum)
Pt presents for routine STD testing. Denies current symptoms.   Also requesting a medication refill for HSV

## 2022-11-04 NOTE — ED Provider Notes (Signed)
[facility]    CSN: [id number] Arrival date & time: 11/04/22  1323      History   Chief Complaint Chief Complaint  Patient presents with   SEXUALLY TRANSMITTED DISEASE   Medication Refill    HPI Douglas Kline is a 26 y.o. male.   Patient presents for evaluation of routine STD testing.  Denies all symptoms, no known exposure.  Requesting refill for valacyclovir which he has been taking daily for maintenance after recent herpes diagnosis.  Has no current rash.   History reviewed. No pertinent past medical history.  There are no problems to display for this patient.   History reviewed. No pertinent surgical history.     Home Medications    Prior to Admission medications   Medication Sig Start Date End Date Taking? Authorizing Provider  valACYclovir (VALTREX) 1000 MG tablet Take 1 tablet (1,000 mg total) by mouth 3 (three) times daily for 14 days. 11/04/22 11/18/22 Yes Rema Lievanos, Elita Boone, NP  acetaminophen (TYLENOL) 500 MG tablet Take 1,000 mg by mouth every 6 (six) hours as needed for moderate pain.    [provider]  acyclovir (ZOVIRAX) 800 MG tablet Take 400-800 mg by mouth daily.    [provider]  bismuth subsalicylate (PEPTO BISMOL) 262 MG chewable tablet Chew 1,048 mg by mouth 3 (three) times daily as needed for indigestion.    [provider]  doxycycline (VIBRAMYCIN) 100 MG capsule Take 1 capsule (100 mg total) by mouth 2 (two) times daily. 05/21/22   Jameson Morrow, Elita Boone, NP  loperamide (IMODIUM) 2 MG capsule Take 1 capsule (2 mg total) by mouth 4 (four) times daily as needed for diarrhea or loose stools. Patient not taking: Reported on 09/06/2020 10/30/15   Derwood Kaplan, MD  ondansetron (ZOFRAN ODT) 8 MG disintegrating tablet Take 1 tablet (8 mg total) by mouth every 8 (eight) hours as needed for nausea. Patient not taking: Reported on 09/06/2020 10/30/15   Derwood Kaplan, MD    Family History Family History  Problem  Relation Age of Onset   Lupus Mother    Hypertension Father     Social History Social History   Tobacco Use   Smoking status: Every Day    Types: Cigars   Smokeless tobacco: Never  Vaping Use   Vaping Use: Some days   Substances: Nicotine, Flavoring  Substance Use Topics   Alcohol use: Yes   Drug use: Yes    Types: Marijuana     Allergies   Morphine and related   Review of Systems Review of Systems  Constitutional: Negative.   Respiratory: Negative.    Cardiovascular: Negative.   Genitourinary: Negative.   Skin: Negative.   Neurological: Negative.      Physical Exam Triage Vital Signs ED Triage Vitals  Enc Vitals Group     BP 11/04/22 1512 (!) 159/81     Pulse Rate 11/04/22 1512 74     Resp 11/04/22 1512 18     Temp 11/04/22 1512 98.4 F (36.9 C)     Temp Source 11/04/22 1512 Oral     SpO2 11/04/22 1512 98 %     Weight --      Height --      Head Circumference --      Peak Flow --      Pain Score 11/04/22 1510 0     Pain Loc --      Pain Edu? --      Excl. in GC? --  No data found.  Updated Vital Signs BP (!) 159/81 (BP Location: Right Arm)   Pulse 74   Temp 98.4 F (36.9 C) (Oral)   Resp 18   SpO2 98%   Visual Acuity Right Eye Distance:   Left Eye Distance:   Bilateral Distance:    Right Eye Near:   Left Eye Near:    Bilateral Near:     Physical Exam Constitutional:      Appearance: Normal appearance.  HENT:     Head: Normocephalic.  Eyes:     Extraocular Movements: Extraocular movements intact.  Pulmonary:     Effort: Pulmonary effort is normal.  Genitourinary:    Comments: deferred Skin:    General: Skin is warm and dry.  Neurological:     Mental Status: He is alert and oriented to person, place, and time. Mental status is at baseline.      UC Treatments / Results  Labs (all labs ordered are listed, but only abnormal results are displayed) Labs Reviewed  CYTOLOGY, (ORAL, ANAL, URETHRAL) ANCILLARY ONLY     EKG   Radiology No results found.  Procedures Procedures (including critical care time)  Medications Ordered in UC Medications - No data to display  Initial Impression / Assessment and Plan / UC Course  I have reviewed the triage vital signs and the nursing notes.  Pertinent labs & imaging results that were available during my care of the patient were reviewed by me and considered in my medical decision making (see chart for details).  Routine screening for STI, herpes simplex   Valacyclovir prescribed, discussed use of medicine for outbreaks versus maintenance, patient will move forward with attempting to treat only when outbreaks are present, may follow-up for further evaluation as needed ,STI labs pending will treat per protocol, advised abstinence until lab results, and/or treatment is complete, advised condom use during all sexual encounters moving, may follow-up with urgent care as needed   Final Clinical Impressions(s) / UC Diagnoses   Final diagnoses:  Routine screening for STI (sexually transmitted infection)  Herpes simplex     Discharge Instructions      Usual herpes medication for outbreaks which typically occur when they are high levels of stress or when you are sick, begin use of medicine as soon as you first noticed it, taking every 8 hours for 7 days   Labs pending 2-3 days, you will be contacted if positive for any sti and treatment will be sent to the pharmacy, you will have to return to the clinic if positive for gonorrhea to receive treatment   Please refrain from having sex until labs results, if positive please refrain from having sex until treatment complete and symptoms resolve   If positive for  Chlamydia  gonorrhea or trichomoniasis please notify partner or partners so they may tested as well  Moving forward, it is recommended you use some form of protection against the transmission of sti infections  such as condoms or dental dams with each  sexual encounter     ED Prescriptions     Medication Sig Dispense Auth. Provider   valACYclovir (VALTREX) 1000 MG tablet Take 1 tablet (1,000 mg total) by mouth 3 (three) times daily for 14 days. 21 tablet Zaleah Ternes, Elita Boone, NP      PDMP not reviewed this encounter.   Valinda Hoar, NP 11/04/22 1541

## 2022-11-04 NOTE — Discharge Instructions (Addendum)
Usual herpes medication for outbreaks which typically occur when they are high levels of stress or when you are sick, begin use of medicine as soon as you first noticed it, taking every 8 hours for 7 days   Labs pending 2-3 days, you will be contacted if positive for any sti and treatment will be sent to the pharmacy, you will have to return to the clinic if positive for gonorrhea to receive treatment   Please refrain from having sex until labs results, if positive please refrain from having sex until treatment complete and symptoms resolve   If positive for  Chlamydia  gonorrhea or trichomoniasis please notify partner or partners so they may tested as well  Moving forward, it is recommended you use some form of protection against the transmission of sti infections  such as condoms or dental dams with each sexual encounter

## 2022-11-07 LAB — CYTOLOGY, (ORAL, ANAL, URETHRAL) ANCILLARY ONLY
Chlamydia: NEGATIVE
Comment: NEGATIVE
Comment: NEGATIVE
Comment: NORMAL
Neisseria Gonorrhea: NEGATIVE
Trichomonas: NEGATIVE

## 2023-02-17 ENCOUNTER — Ambulatory Visit (HOSPITAL_COMMUNITY)
Admission: EM | Admit: 2023-02-17 | Discharge: 2023-02-17 | Disposition: A | Discharge status: Home / Self Care | Payer: 59 | Attending: Emergency Medicine | Admitting: Emergency Medicine

## 2023-02-17 ENCOUNTER — Encounter (HOSPITAL_COMMUNITY): Payer: Self-pay | Admitting: Emergency Medicine

## 2023-02-17 ENCOUNTER — Other Ambulatory Visit: Payer: Self-pay

## 2023-02-17 DIAGNOSIS — Z709 Sex counseling, unspecified: Secondary | ICD-10-CM | POA: Diagnosis present

## 2023-02-17 DIAGNOSIS — B009 Herpesviral infection, unspecified: Secondary | ICD-10-CM

## 2023-02-17 DIAGNOSIS — Z8619 Personal history of other infectious and parasitic diseases: Secondary | ICD-10-CM

## 2023-02-17 DIAGNOSIS — Z113 Encounter for screening for infections with a predominantly sexual mode of transmission: Secondary | ICD-10-CM

## 2023-02-17 LAB — HIV ANTIBODY (ROUTINE TESTING W REFLEX): HIV Screen 4th Generation wRfx: NONREACTIVE

## 2023-02-17 MED ORDER — VALACYCLOVIR HCL 500 MG PO TABS: 500.0000 mg | ORAL_TABLET | Freq: Two times a day (BID) | ORAL | 3 refills | Status: DC

## 2023-02-17 MED ORDER — VALACYCLOVIR HCL 1 G PO TABS: 500.0000 mg | ORAL_TABLET | Freq: Two times a day (BID) | ORAL | 3 refills | Status: DC

## 2023-02-17 NOTE — ED Provider Notes (Signed)
[facility]    CSN: [id number] Arrival date & time: 02/17/23  1007      History   Chief Complaint Chief Complaint  Patient presents with   SEXUALLY TRANSMITTED DISEASE    HPI Douglas Kline is a 27 y.o. male.  Here for STD screening including blood work He denies any known exposure or symptoms at this time. 1 sexual partner, uses barrier protection   History of genital herpes, he takes valacyclovir for outbreaks and requesting refill. No current rash or lesions.  History reviewed. No pertinent past medical history.  There are no problems to display for this patient.   History reviewed. No pertinent surgical history.     Home Medications    Prior to Admission medications   Medication Sig Start Date End Date Taking? Authorizing Provider  acetaminophen (TYLENOL) 500 MG tablet Take 1,000 mg by mouth every 6 (six) hours as needed for moderate pain.    [provider]  bismuth subsalicylate (PEPTO BISMOL) 262 MG chewable tablet Chew 1,048 mg by mouth 3 (three) times daily as needed for indigestion.    [provider]  valACYclovir (VALTREX) 500 MG tablet Take 1 tablet (500 mg total) by mouth 2 (two) times daily. 02/17/23   Arli Bree, Lurena Joinerebecca, PA-C    Family History Family History  Problem Relation Age of Onset   Lupus Mother    Hypertension Father     Social History Social History   Tobacco Use   Smoking status: Every Day    Types: Cigars   Smokeless tobacco: Never  Vaping Use   Vaping Use: Former   Substances: Nicotine, Flavoring  Substance Use Topics   Alcohol use: Yes   Drug use: Yes    Types: Marijuana     Allergies   Morphine and related   Review of Systems Review of Systems As per HPI  Physical Exam Triage Vital Signs ED Triage Vitals [02/17/23 1030]  Enc Vitals Group     BP      Pulse      Resp      Temp      Temp src      SpO2      Weight      Height      Head Circumference      Peak Flow      Pain  Score 0     Pain Loc      Pain Edu?      Excl. in GC?    No data found.  Updated Vital Signs BP 115/71 (BP Location: Right Arm)   Pulse 77   Temp 98 F (36.7 C) (Oral)   Resp 18   SpO2 98%   Physical Exam Vitals and nursing note reviewed.  Constitutional:      General: He is not in acute distress.    Appearance: Normal appearance.  HENT:     Mouth/Throat:     Pharynx: Oropharynx is clear.  Cardiovascular:     Rate and Rhythm: Normal rate and regular rhythm.     Pulses: Normal pulses.  Pulmonary:     Effort: Pulmonary effort is normal.  Neurological:     Mental Status: He is alert and oriented to person, place, and time.     UC Treatments / Results  Labs (all labs ordered are listed, but only abnormal results are displayed) Labs Reviewed  HIV ANTIBODY (ROUTINE TESTING W REFLEX)  RPR  CYTOLOGY, (ORAL, ANAL, URETHRAL) ANCILLARY ONLY  EKG  Radiology No results found.  Procedures Procedures (including critical care time)  Medications Ordered in UC Medications - No data to display  Initial Impression / Assessment and Plan / UC Course  I have reviewed the triage vital signs and the nursing notes.  Pertinent labs & imaging results that were available during my care of the patient were reviewed by me and considered in my medical decision making (see chart for details).  Cytology swab, HIV/RPR pending Sent Valtrex 500 mg BID x 3 days to use if starting outbreak Discussion with patient about HSV, STDs Advised to follow with PCP  Final Clinical Impressions(s) / UC Diagnoses   Final diagnoses:  Screen for STD (sexually transmitted disease)  History of herpes genitalis  Sex counseling     Discharge Instructions      We will call you if anything on your swab or blood work returns positive.  You will also be able to see these results on MyChart.  Please abstain from sexual intercourse until your results return.  If you start to have a herpes outbreak,  you can take the Valtrex twice daily for 3 days to treat the outbreak. I have sent a few refills incase you need in the future  Please return if needed. I also recommend follow with your primary care provider.     ED Prescriptions     Medication Sig Dispense Auth. Provider   valACYclovir (VALTREX) 500 MG tablet Take 1 tablet (500 mg total) by mouth 2 (two) times daily. 6 tablet Lakea Mittelman, Lurena Joiner, PA-C      PDMP not reviewed this encounter.   Akshara Blumenthal, Lurena Joiner, [place] 02/17/23 1127

## 2023-02-17 NOTE — ED Triage Notes (Signed)
Requesting std screening.  Denies symptoms.  Requesting medication for herpes

## 2023-02-17 NOTE — Discharge Instructions (Addendum)
We will call you if anything on your swab or blood work returns positive.  You will also be able to see these results on MyChart.  Please abstain from sexual intercourse until your results return.  If you start to have a herpes outbreak, you can take the Valtrex twice daily for 3 days to treat the outbreak. I have sent a few refills incase you need in the future  Please return if needed. I also recommend follow with your primary care provider.

## 2023-02-18 LAB — RPR: RPR Ser Ql: NONREACTIVE

## 2023-02-19 LAB — CYTOLOGY, (ORAL, ANAL, URETHRAL) ANCILLARY ONLY
Chlamydia: POSITIVE — AB
Comment: NEGATIVE
Comment: NEGATIVE
Comment: NORMAL
Neisseria Gonorrhea: NEGATIVE
Trichomonas: POSITIVE — AB

## 2023-02-20 ENCOUNTER — Telehealth (HOSPITAL_COMMUNITY): Payer: Self-pay | Admitting: Emergency Medicine

## 2023-02-20 MED ORDER — METRONIDAZOLE 500 MG PO TABS: 2000.0000 mg | ORAL_TABLET | Freq: Once | ORAL | 0 refills | Status: AC

## 2023-02-20 MED ORDER — DOXYCYCLINE HYCLATE 100 MG PO CAPS: 100.0000 mg | ORAL_CAPSULE | Freq: Two times a day (BID) | ORAL | 0 refills | Status: AC

## 2023-08-16 ENCOUNTER — Telehealth (HOSPITAL_COMMUNITY): Payer: Self-pay | Admitting: Emergency Medicine

## 2023-08-16 ENCOUNTER — Ambulatory Visit (HOSPITAL_COMMUNITY)
Admission: EM | Admit: 2023-08-16 | Discharge: 2023-08-16 | Disposition: A | Discharge status: Home / Self Care | Payer: 59 | Attending: Emergency Medicine | Admitting: Emergency Medicine

## 2023-08-16 ENCOUNTER — Encounter (HOSPITAL_COMMUNITY): Payer: Self-pay

## 2023-08-16 DIAGNOSIS — Z113 Encounter for screening for infections with a predominantly sexual mode of transmission: Secondary | ICD-10-CM | POA: Diagnosis not present

## 2023-08-16 DIAGNOSIS — R590 Localized enlarged lymph nodes: Secondary | ICD-10-CM | POA: Insufficient documentation

## 2023-08-16 LAB — CBC WITH DIFFERENTIAL/PLATELET
Abs Immature Granulocytes: 0.01 10*3/uL (ref 0.00–0.07)
Basophils Absolute: 0 10*3/uL (ref 0.0–0.1)
Basophils Relative: 1 %
Eosinophils Absolute: 0 10*3/uL (ref 0.0–0.5)
Eosinophils Relative: 1 %
HCT: 40.8 % (ref 39.0–52.0)
Hemoglobin: 13.7 g/dL (ref 13.0–17.0)
Immature Granulocytes: 0 %
Lymphocytes Relative: 33 %
Lymphs Abs: 1.7 10*3/uL (ref 0.7–4.0)
MCH: 31 pg (ref 26.0–34.0)
MCHC: 33.6 g/dL (ref 30.0–36.0)
MCV: 92.3 fL (ref 80.0–100.0)
Monocytes Absolute: 0.6 10*3/uL (ref 0.1–1.0)
Monocytes Relative: 12 %
Neutro Abs: 2.8 10*3/uL (ref 1.7–7.7)
Neutrophils Relative %: 53 %
Platelets: 250 10*3/uL (ref 150–400)
RBC: 4.42 MIL/uL (ref 4.22–5.81)
RDW: 11.3 % — ABNORMAL LOW (ref 11.5–15.5)
WBC: 5.1 10*3/uL (ref 4.0–10.5)
nRBC: 0 % (ref 0.0–0.2)

## 2023-08-16 LAB — LACTATE DEHYDROGENASE: LDH: 215 U/L — ABNORMAL HIGH (ref 98–192)

## 2023-08-16 LAB — COMPREHENSIVE METABOLIC PANEL
ALT: 11 U/L (ref 0–44)
AST: 31 U/L (ref 15–41)
Albumin: 4 g/dL (ref 3.5–5.0)
Alkaline Phosphatase: 63 U/L (ref 38–126)
Anion gap: 10 (ref 5–15)
BUN: 12 mg/dL (ref 6–20)
CO2: 25 mmol/L (ref 22–32)
Calcium: 9.2 mg/dL (ref 8.9–10.3)
Chloride: 101 mmol/L (ref 98–111)
Creatinine, Ser: 1.06 mg/dL (ref 0.61–1.24)
GFR, Estimated: >60 mL/min (ref >60)
Glucose, Bld: 69 mg/dL — ABNORMAL LOW (ref 70–99)
Potassium: 3.8 mmol/L (ref 3.5–5.1)
Sodium: 136 mmol/L (ref 135–145)
Total Bilirubin: 1.1 mg/dL (ref 0.3–1.2)
Total Protein: 6.8 g/dL (ref 6.5–8.1)

## 2023-08-16 LAB — HIV ANTIBODY (ROUTINE TESTING W REFLEX): HIV Screen 4th Generation wRfx: NONREACTIVE

## 2023-08-16 MED ORDER — VALACYCLOVIR HCL 500 MG PO TABS: 500.0000 mg | ORAL_TABLET | Freq: Two times a day (BID) | ORAL | 3 refills | Status: DC

## 2023-08-16 NOTE — Discharge Instructions (Signed)
Some of your blood work results will return today and I will give you a phone call if anything is abnormal. Your other results were returned over the next few days and someone will call and adjust treatment as needed.  I have sent a refill for your Valtrex with some additional refills to take as needed for outbreaks.  Return here as needed.

## 2023-08-16 NOTE — ED Triage Notes (Signed)
Pt is here for std- testing. Pt denies any recent symptoms. Pt states he has a history of HSV. Pt request refill on Valtrex.

## 2023-08-16 NOTE — Telephone Encounter (Signed)
Called patient and spoke with him directly regarding results of CBC, CMP, and LDH.  Recommended following up with primary care if symptoms persist.  Patient agreeable to plan at this time.  No adjustment to treatment plan at this time.

## 2023-08-16 NOTE — ED Provider Notes (Signed)
[facility]    CSN: [id number] Arrival date & time: 08/16/23  1515      History   Chief Complaint Chief Complaint  Patient presents with   Exposure to STD    HPI Douglas Kline is a 27 y.o. male.   Patient presents for STD screening.  Denies any recent symptoms.  Reports history of HSV and is requesting refill on Valtrex.  Denies current HSV flare.  Patient also reports noticing his lymph nodes swelling x 1 week.  Denies sore throat, cough, fever, and headache.   Exposure to STD Pertinent negatives include no chest pain, no abdominal pain, no headaches and no shortness of breath.    History reviewed. No pertinent past medical history.  There are no problems to display for this patient.   History reviewed. No pertinent surgical history.     Home Medications    Prior to Admission medications   Medication Sig Start Date End Date Taking? Authorizing Provider  valACYclovir (VALTREX) 500 MG tablet Take 1 tablet (500 mg total) by mouth 2 (two) times daily. 08/16/23   Letta Kocher, NP    Family History Family History  Problem Relation Age of Onset   Lupus Mother    Hypertension Father     Social History Social History   Tobacco Use   Smoking status: Every Day    Types: Cigars   Smokeless tobacco: Never  Vaping Use   Vaping status: Former   Substances: Nicotine, Flavoring  Substance Use Topics   Alcohol use: Yes   Drug use: Yes    Types: Marijuana     Allergies   Morphine and codeine   Review of Systems Review of Systems  Constitutional:  Negative for chills, fatigue and fever.  HENT:  Negative for congestion and sore throat.   Respiratory:  Negative for cough and shortness of breath.   Cardiovascular:  Negative for chest pain.  Gastrointestinal:  Negative for abdominal pain.  Genitourinary:  Negative for dysuria, flank pain, frequency, genital sores, hematuria, penile discharge, penile pain, penile swelling, scrotal swelling,  testicular pain and urgency.  Neurological:  Negative for dizziness, weakness, light-headedness and headaches.     Physical Exam Triage Vital Signs ED Triage Vitals [08/16/23 1527]  Encounter Vitals Group     BP 120/67     Systolic BP Percentile      Diastolic BP Percentile      Pulse Rate 83     Resp 16     Temp 98.9 F (37.2 C)     Temp Source Oral     SpO2 98 %     Weight      Height      Head Circumference      Peak Flow      Pain Score 0     Pain Loc      Pain Education      Exclude from Growth Chart    No data found.  Updated Vital Signs BP 120/67 (BP Location: Left Arm)   Pulse 83   Temp 98.9 F (37.2 C) (Oral)   Resp 16   SpO2 98%   Visual Acuity Right Eye Distance:   Left Eye Distance:   Bilateral Distance:    Right Eye Near:   Left Eye Near:    Bilateral Near:     Physical Exam Vitals and nursing note reviewed.  Constitutional:      General: He is awake. He is not in acute distress.  Appearance: Normal appearance. He is well-developed and well-groomed. He is not ill-appearing, toxic-appearing or diaphoretic.  HENT:     Right Ear: Tympanic membrane, ear canal and external ear normal.     Left Ear: Tympanic membrane, ear canal and external ear normal.     Nose: Nose normal.     Mouth/Throat:     Mouth: Mucous membranes are moist.     Pharynx: Oropharynx is clear.  Neck:     Comments: Painless, swollen cervical lymph nodes noted throughout. 3 to 4 cm in size. Genitourinary:    Comments: Exam deferred.  Musculoskeletal:     Cervical back: Normal range of motion.  Lymphadenopathy:     Cervical: Cervical adenopathy present.     Right cervical: Superficial cervical adenopathy, deep cervical adenopathy and posterior cervical adenopathy present.     Left cervical: Superficial cervical adenopathy, deep cervical adenopathy and posterior cervical adenopathy present.  Skin:    General: Skin is warm and dry.  Neurological:     General: No focal  deficit present.     Mental Status: He is alert and oriented to person, place, and time.  Psychiatric:        Behavior: Behavior is cooperative.      UC Treatments / Results  Labs (all labs ordered are listed, but only abnormal results are displayed) Labs Reviewed  CBC WITH DIFFERENTIAL/PLATELET  COMPREHENSIVE METABOLIC PANEL  LACTATE DEHYDROGENASE  HIV ANTIBODY (ROUTINE TESTING W REFLEX)  RPR  CYTOLOGY, (ORAL, ANAL, URETHRAL) ANCILLARY ONLY    EKG   Radiology No results found.  Procedures Procedures (including critical care time)  Medications Ordered in UC Medications - No data to display  Initial Impression / Assessment and Plan / UC Course  I have reviewed the triage vital signs and the nursing notes.  Pertinent labs & imaging results that were available during my care of the patient were reviewed by me and considered in my medical decision making (see chart for details).     Patient presented for STD screening.  Denies any recent symptoms or exposures.  History of HSV requesting refill of Valtrex. Denies current HSV flare.  Patient also reports lymph node swelling x 1 week.  Denies any related symptoms.  Upon assessment patient has painless swelling noted to bilateral superficial cervical, deep cervical, and posterior cervical lymph nodes measuring about 3 to 4 cm in size.  No other significant findings upon exam.  GU exam deferred.  Patient performed self swab for STD.  HIV and syphilis testing ordered. CBC with differential, CMP, and LDH ordered to further evaluate cervical lymphadenopathy.  Prescribed Valtrex with a couple refills for outbreaks.  Discussed follow-up and return precautions. Final Clinical Impressions(s) / UC Diagnoses   Final diagnoses:  Screening for STD (sexually transmitted disease)  Lymphadenopathy, cervical     Discharge Instructions      Some of your blood work results will return today and I will give you a phone call if anything is  abnormal. Your other results were returned over the next few days and someone will call and adjust treatment as needed.  I have sent a refill for your Valtrex with some additional refills to take as needed for outbreaks.  Return here as needed.    ED Prescriptions     Medication Sig Dispense Auth. Provider   valACYclovir (VALTREX) 500 MG tablet Take 1 tablet (500 mg total) by mouth 2 (two) times daily. 6 tablet Letta Kocher, NP  PDMP not reviewed this encounter.   Wynonia Lawman A, NP 08/16/23 1820

## 2023-08-17 LAB — RPR: RPR Ser Ql: NONREACTIVE

## 2023-08-20 LAB — CYTOLOGY, (ORAL, ANAL, URETHRAL) ANCILLARY ONLY
Chlamydia: NEGATIVE
Comment: NEGATIVE
Comment: NEGATIVE
Comment: NORMAL
Neisseria Gonorrhea: NEGATIVE
Trichomonas: NEGATIVE

## 2023-09-12 ENCOUNTER — Encounter (HOSPITAL_COMMUNITY): Payer: Self-pay

## 2023-09-12 ENCOUNTER — Ambulatory Visit (HOSPITAL_COMMUNITY)
Admission: EM | Admit: 2023-09-12 | Discharge: 2023-09-12 | Disposition: A | Discharge status: Home / Self Care | Payer: 59 | Attending: Family Medicine | Admitting: Family Medicine

## 2023-09-12 DIAGNOSIS — R590 Localized enlarged lymph nodes: Secondary | ICD-10-CM | POA: Diagnosis not present

## 2023-09-12 LAB — POCT MONO SCREEN (KUC): Mono, POC: NEGATIVE

## 2023-09-12 MED ORDER — AZITHROMYCIN 250 MG PO TABS: 250.0000 mg | ORAL_TABLET | Freq: Every day | ORAL | 0 refills | Status: DC

## 2023-09-12 NOTE — ED Triage Notes (Signed)
Pt states swollen glands in his neck for the past month.

## 2023-09-13 ENCOUNTER — Encounter (INDEPENDENT_AMBULATORY_CARE_PROVIDER_SITE_OTHER): Payer: Self-pay | Admitting: Otolaryngology

## 2023-09-15 NOTE — ED Provider Notes (Signed)
[facility]   [id number] 09/12/23 Arrival Time: 1508  ASSESSMENT & PLAN:  1. Cervical lymphadenopathy    Monospot negative. Recent labs reviewed as below. Trial of: Meds ordered this encounter  Medications   azithromycin (ZITHROMAX) 250 MG tablet    Sig: Take 1 tablet (250 mg total) by mouth daily. Take first 2 tablets together, then 1 every day until finished.    Dispense:  6 tablet    Refill:  0     Follow-up Information     Schedule an appointment as soon as possible for a visit  with Knox Royalty, MD.   Specialty: Family Medicine Why: For follow up. Contact information: [address] [place] [address] [phone]                Orders Placed This Encounter  Procedures   Ambulatory referral to ENT    Referral Priority:   Routine    Referral Type:   Consultation    Referral Reason:   Specialty Services Required    Requested Specialty:   Otolaryngology    Number of Visits Requested:   1   POC mono screen    Standing Status:   Standing    Number of Occurrences:   1     Reviewed expectations re: course of current medical issues. Questions answered. Outlined signs and symptoms indicating need for more acute intervention. Patient verbalized understanding. After Visit Summary given.   SUBJECTIVE: History from: patient.  Douglas Kline is a 27 y.o. male who reports persistent cervical LAD; seen here last; note reviewed. Areas are the same. Denies resp/swallowing difficulties. No h/o similar.  Social History   Tobacco Use  Smoking Status Every Day   Types: Cigars  Smokeless Tobacco Never    OBJECTIVE:  Vitals:   09/12/23 1515 09/12/23 1517  BP:  124/73  Pulse: 94   Resp: 16   Temp: 98.2 F (36.8 C)   TempSrc: Oral   SpO2: 99%      General appearance: alert; no distress HEENT: [place]; AT Neck: supple with bilateral cervical/post-auricular LAD; trachea midline Lungs: unlabored respirations, symmetrical air entry; cough:  absent; no respiratory distress Skin: warm and dry Psychological: alert and cooperative; normal mood and affect  Allergies  Allergen Reactions   Morphine And Codeine Hives    History reviewed. No pertinent past medical history. Family History  Problem Relation Age of Onset   Lupus Mother    Hypertension Father    Social History   Socioeconomic History   Marital status: Single    Spouse name: Not on file   Number of children: Not on file   Years of education: Not on file   Highest education level: Not on file  Occupational History   Not on file  Tobacco Use   Smoking status: Every Day    Types: Cigars   Smokeless tobacco: Never  Vaping Use   Vaping status: Former   Substances: Nicotine, Flavoring  Substance and Sexual Activity   Alcohol use: Yes   Drug use: Yes    Types: Marijuana   Sexual activity: Yes  Other Topics Concern   Not on file  Social History Narrative   Not on file   Social Determinants of Health   Financial Resource Strain: Not on file  Food Insecurity: Not on file  Transportation Needs: Not on file  Physical Activity: Not on file  Stress: Not on file  Social Connections: Not on file  Intimate Partner Violence: Not  on file             Mardella Layman, MD 09/15/23 [phone]

## 2023-09-24 ENCOUNTER — Emergency Department (HOSPITAL_COMMUNITY)
Admission: EM | Admit: 2023-09-24 | Discharge: 2023-09-24 | Disposition: A | Discharge status: Home / Self Care | Payer: 59 | Attending: Emergency Medicine | Admitting: Emergency Medicine

## 2023-09-24 ENCOUNTER — Emergency Department (HOSPITAL_COMMUNITY): Payer: 59

## 2023-09-24 ENCOUNTER — Other Ambulatory Visit: Payer: Self-pay

## 2023-09-24 ENCOUNTER — Encounter (HOSPITAL_COMMUNITY): Payer: Self-pay

## 2023-09-24 DIAGNOSIS — R591 Generalized enlarged lymph nodes: Secondary | ICD-10-CM

## 2023-09-24 DIAGNOSIS — R590 Localized enlarged lymph nodes: Secondary | ICD-10-CM | POA: Diagnosis not present

## 2023-09-24 DIAGNOSIS — R221 Localized swelling, mass and lump, neck: Secondary | ICD-10-CM | POA: Diagnosis present

## 2023-09-24 LAB — COMPREHENSIVE METABOLIC PANEL
ALT: 18 U/L (ref 0–44)
AST: 29 U/L (ref 15–41)
Albumin: 4.4 g/dL (ref 3.5–5.0)
Alkaline Phosphatase: 61 U/L (ref 38–126)
Anion gap: 7 (ref 5–15)
BUN: 11 mg/dL (ref 6–20)
CO2: 25 mmol/L (ref 22–32)
Calcium: 9.1 mg/dL (ref 8.9–10.3)
Chloride: 104 mmol/L (ref 98–111)
Creatinine, Ser: 0.97 mg/dL (ref 0.61–1.24)
GFR, Estimated: >60 mL/min (ref >60)
Glucose, Bld: 81 mg/dL (ref 70–99)
Potassium: 3.9 mmol/L (ref 3.5–5.1)
Sodium: 136 mmol/L (ref 135–145)
Total Bilirubin: 0.8 mg/dL (ref <1.2)
Total Protein: 7.3 g/dL (ref 6.5–8.1)

## 2023-09-24 LAB — CBC WITH DIFFERENTIAL/PLATELET
Abs Immature Granulocytes: 0.01 10*3/uL (ref 0.00–0.07)
Basophils Absolute: 0 10*3/uL (ref 0.0–0.1)
Basophils Relative: 1 %
Eosinophils Absolute: 0 10*3/uL (ref 0.0–0.5)
Eosinophils Relative: 0 %
HCT: 41.9 % (ref 39.0–52.0)
Hemoglobin: 14 g/dL (ref 13.0–17.0)
Immature Granulocytes: 0 %
Lymphocytes Relative: 36 %
Lymphs Abs: 1.6 10*3/uL (ref 0.7–4.0)
MCH: 30.7 pg (ref 26.0–34.0)
MCHC: 33.4 g/dL (ref 30.0–36.0)
MCV: 91.9 fL (ref 80.0–100.0)
Monocytes Absolute: 0.4 10*3/uL (ref 0.1–1.0)
Monocytes Relative: 9 %
Neutro Abs: 2.4 10*3/uL (ref 1.7–7.7)
Neutrophils Relative %: 54 %
Platelets: 237 10*3/uL (ref 150–400)
RBC: 4.56 MIL/uL (ref 4.22–5.81)
RDW: 11.6 % (ref 11.5–15.5)
WBC: 4.5 10*3/uL (ref 4.0–10.5)
nRBC: 0 % (ref 0.0–0.2)

## 2023-09-24 LAB — HIV ANTIBODY (ROUTINE TESTING W REFLEX): HIV Screen 4th Generation wRfx: NONREACTIVE

## 2023-09-24 LAB — LACTATE DEHYDROGENASE: LDH: 170 U/L (ref 98–192)

## 2023-09-24 NOTE — Discharge Instructions (Addendum)
You will hear from Dr. Truett Perna who is a cancer doctor, like we talked about. He will arrange follow up in their office for further testing. Part of that testing will be an appointment with a surgeon to obtain a biopsy of the swollen lymph nodes for testing.   Return to the ED if you develop any fever, nigh sweats, illness, or new concern.

## 2023-09-24 NOTE — ED Provider Notes (Signed)
[place] [facility] Provider Note   CSN: [id number] Arrival date & time: 09/24/23  1103     History  Chief Complaint  Patient presents with   neck swelling    Douglas Kline is a 27 y.o. male.  Patient to ED for further evaluation of swollen lymph nodes. He states they have been there for over a month. Seen at Urgent Care 10/3 and 10/30 reflecting lymph nodes began to swell toward the end of September. No pain. No fever, sore throat. He presents for frequent STD checks, all reviewed testing negative.   The history is provided by the patient. No language interpreter was used.       Home Medications Prior to Admission medications   Medication Sig Start Date End Date Taking? Authorizing Provider  azithromycin (ZITHROMAX) 250 MG tablet Take 1 tablet (250 mg total) by mouth daily. Take first 2 tablets together, then 1 every day until finished. 09/12/23   Mardella Layman, MD  valACYclovir (VALTREX) 500 MG tablet Take 1 tablet (500 mg total) by mouth 2 (two) times daily. 08/16/23   Letta Kocher, NP      Allergies    Morphine and codeine    Review of Systems   Review of Systems  Physical Exam Updated Vital Signs BP 132/85 (BP Location: Left Arm)   Pulse 83   Temp 98.3 F (36.8 C) (Oral)   Resp 16   Ht 5\' 7"  (1.702 m)   Wt 65.8 kg   SpO2 100%   BMI 22.72 kg/m  Physical Exam Vitals and nursing note reviewed.  Constitutional:      General: He is not in acute distress.    Appearance: Normal appearance.  HENT:     Head: Normocephalic.     Right Ear: Tympanic membrane normal.     Left Ear: Tympanic membrane normal.     Nose: Nose normal.     Mouth/Throat:     Mouth: Mucous membranes are moist.  Eyes:     Pupils: Pupils are equal, round, and reactive to light.  Neck:     Comments: Widespread anterior and posterior cervical adenopathy.  Cardiovascular:     Rate and Rhythm: Normal rate and regular rhythm.     Heart sounds:  No murmur heard. Pulmonary:     Effort: Pulmonary effort is normal.     Breath sounds: Normal breath sounds.  Abdominal:     Palpations: Abdomen is soft.     Tenderness: There is no abdominal tenderness.  Musculoskeletal:        General: Normal range of motion.  Lymphadenopathy:     Head:     Right side of head: Submental, submandibular, posterior auricular and occipital adenopathy present.     Left side of head: Submental, submandibular, posterior auricular and occipital adenopathy present.     Cervical: Cervical adenopathy present.     Right cervical: Superficial cervical adenopathy and posterior cervical adenopathy present.     Left cervical: Superficial cervical adenopathy and posterior cervical adenopathy present.     Upper Body:     Right upper body: Axillary adenopathy present.     Left upper body: Axillary adenopathy present.     Lower Body: Right inguinal adenopathy present. Left inguinal adenopathy present.  Skin:    General: Skin is warm and dry.     Findings: No erythema.  Neurological:     Mental Status: He is alert and oriented to person, place, and time.  ED Results / Procedures / Treatments   Labs (all labs ordered are listed, but only abnormal results are displayed) Labs Reviewed  CBC WITH DIFFERENTIAL/PLATELET  COMPREHENSIVE METABOLIC PANEL  LACTATE DEHYDROGENASE  HIV ANTIBODY (ROUTINE TESTING W REFLEX)  GC/CHLAMYDIA PROBE AMP ([place]) NOT AT [facility]   Results for orders placed or performed during the hospital encounter of 09/24/23  CBC with Differential  Result Value Ref Range   WBC 4.5 4.0 - 10.5 K/uL   RBC 4.56 4.22 - 5.81 MIL/uL   Hemoglobin 14.0 13.0 - 17.0 g/dL   HCT 40.9 81.1 - 91.4 %   MCV 91.9 80.0 - 100.0 fL   MCH 30.7 26.0 - 34.0 pg   MCHC 33.4 30.0 - 36.0 g/dL   RDW 78.2 95.6 - 21.3 %   Platelets 237 150 - 400 K/uL   nRBC 0.0 0.0 - 0.2 %   Neutrophils Relative % 54 %   Neutro Abs 2.4 1.7 - 7.7 K/uL   Lymphocytes Relative 36 %    Lymphs Abs 1.6 0.7 - 4.0 K/uL   Monocytes Relative 9 %   Monocytes Absolute 0.4 0.1 - 1.0 K/uL   Eosinophils Relative 0 %   Eosinophils Absolute 0.0 0.0 - 0.5 K/uL   Basophils Relative 1 %   Basophils Absolute 0.0 0.0 - 0.1 K/uL   Immature Granulocytes 0 %   Abs Immature Granulocytes 0.01 0.00 - 0.07 K/uL  Comprehensive metabolic panel  Result Value Ref Range   Sodium 136 135 - 145 mmol/L   Potassium 3.9 3.5 - 5.1 mmol/L   Chloride 104 98 - 111 mmol/L   CO2 25 22 - 32 mmol/L   Glucose, Bld 81 70 - 99 mg/dL   BUN 11 6 - 20 mg/dL   Creatinine, Ser 0.86 0.61 - 1.24 mg/dL   Calcium 9.1 8.9 - 57.8 mg/dL   Total Protein 7.3 6.5 - 8.1 g/dL   Albumin 4.4 3.5 - 5.0 g/dL   AST 29 15 - 41 U/L   ALT 18 0 - 44 U/L   Alkaline Phosphatase 61 38 - 126 U/L   Total Bilirubin 0.8 <1.2 mg/dL   GFR, Estimated >46 >96 mL/min   Anion gap 7 5 - 15  HIV Antibody (routine testing w rflx)  Result Value Ref Range   HIV Screen 4th Generation wRfx Non Reactive Non Reactive  Lactate dehydrogenase  Result Value Ref Range   LDH 170 98 - 192 U/L     EKG None  Radiology No results found. DG Chest 2 View  Result Date: 09/24/2023 CLINICAL DATA:  Right neck and chest pain EXAM: CHEST - 2 VIEW COMPARISON:  10/07/2010 FINDINGS: The heart size and mediastinal contours are within normal limits. Both lungs are clear. The visualized skeletal structures are unremarkable. IMPRESSION: No active cardiopulmonary disease. Electronically Signed   By: Judie Petit.  Shick M.D.   On: 09/24/2023 15:39    Procedures Procedures    Medications Ordered in ED Medications - No data to display  ED Course/ Medical Decision Making/ A&P                                 Medical Decision Making This patient presents to the ED for concern of swollen lymph nodes, this involves an extensive number of treatment options, and is a complaint that carries with it a high risk of complications and morbidity.  The differential diagnosis includes  lymph  node infection, lymphoma   Co morbidities that complicate the patient evaluation  No so-morbidities   Additional history obtained:  External records from outside source obtained and reviewed including Review Urgent care notes 10/3, 10/30   Lab Tests:  I Ordered, and personally interpreted labs.  The pertinent results include:  CBC, Cmet, LDH - no significant abnormalities.     Imaging Studies ordered:  I ordered imaging studies including CXR  I independently visualized and interpreted imaging which showed No infiltrates or consolidations I agree with the radiologist interpretation   Consultations Obtained:  I requested consultation with the oncology, Dr. Truett Perna,  and discussed lab and imaging findings as well as pertinent plan - they recommend: Dr. Truett Perna will schedule needed follow up in the oncology clinic as well as connection with surgeon for biopsy.    Problem List / ED Course / Critical interventions / Medication management  Enlarged, widespread, painless lymph nodes - heightened concern for lymphoma  Social Determinants of Health:  Limited access to healthcare   Test / Admission - Considered:  Oncology will arrange follow up for further evaluation and treatment    Amount and/or Complexity of Data Reviewed Labs: ordered. Radiology: ordered.           Final Clinical Impression(s) / ED Diagnoses Final diagnoses:  Generalized lymphadenopathy    Rx / DC Orders ED Discharge Orders     None         Danne Harbor 09/24/23 2123    Ernie Avena, MD 09/25/23 [phone]

## 2023-09-24 NOTE — ED Triage Notes (Signed)
Patient is here for evaluation of right sided neck swelling. Reports going on for the past month and half. Has seen other providers for the same. Was prescribed antibiotics to help, but nothing has resolved.

## 2023-09-25 LAB — GC/CHLAMYDIA PROBE AMP (~~LOC~~) NOT AT ARMC
Chlamydia: NEGATIVE
Comment: NEGATIVE
Comment: NORMAL
Neisseria Gonorrhea: NEGATIVE

## 2023-09-25 NOTE — Progress Notes (Signed)
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  On review of the previous records patient presented to UC on 08/16/23 for STD exposure and reported lymph node swelling x 1 week. Physical exam documented with painless, swollen cervical lymph nodes noted throughout, 3 to 4 cm in size. Labs from that same visit showed an elevated LDH 215  and negative HIV, syphilis and STD tests.  He was prescribed Valtrex as he was concerned about HSV-2 outbreak although denied any vesicular lesions at that time.e.  Patient returned to [facility] on 10/30 for persistent cervical lymphadenopathy. At that visit he had negative mono test and was prescribed a z-pak. Patient had ED work up for neck swelling yesterday which prompted oncology referral. Labs from ED visit were all WNL including CBC, CMP, LDH, and HIV testing negative. Chest xray was also unremarkable.  On exam today patient is accompanied by his mother who provides additional history. Patient reports he first noticed swelling in his neck x 8 weeks ago.  Since onset lymph nodes have been approximately the same size.  Patient noticed a swollen lymph nodes in his underarms and groin about 6 weeks ago he thinks.  He denies any associated pain.  He denies any recent illness.  He reports after taking the Z-Pak there were no changes noted.He denies any fever or weight loss.  He did have night sweats twice last week which is unusual for him.  Denies any current dental pain or infection. Patient admits to social drinking on weekends and smokes marijuana every other day x 5 years. Denies any history of substance abuse or IV drug use.  His family history includes maternal  grandfather with colon cancer, paternal grandmother with pancreatic cancer and maternal great grandmother with breast cancer.     MEDICAL HISTORY:  No past medical history on file.  SURGICAL HISTORY: No past surgical history on file.  SOCIAL HISTORY: Social History   Socioeconomic History   Marital status: Single    Spouse name: Not on file   Number of children: Not on file   Years of education: Not on file   Highest education level: Not on file  Occupational History   Not on file  Tobacco Use   Smoking status: Every Day    Types: Cigars   Smokeless tobacco: Never  Vaping Use   Vaping status: Former   Substances: Nicotine, Flavoring  Substance and Sexual Activity   Alcohol use: Yes   Drug use: Yes    Types: Marijuana   Sexual activity: Yes  Other Topics Concern   Not on file  Social History Narrative   Not on file   Social Determinants of Health   Financial Resource Strain: Not on file  Food Insecurity: Not on file  Transportation Needs: Not on file  Physical Activity: Not on file  Stress: Not on file  Social Connections: Not on file  Intimate Partner Violence: Not on file    FAMILY HISTORY: Family History  Problem Relation Age of Onset   Lupus Mother    Hypertension Father     ALLERGIES:  is allergic to morphine and codeine.  MEDICATIONS:  Current Outpatient Medications  Medication Sig Dispense Refill   azithromycin (ZITHROMAX) 250 MG  tablet Take 1 tablet (250 mg total) by mouth daily. Take first 2 tablets together, then 1 every day until finished. (Patient not taking: Reported on 09/27/2023) 6 tablet 0   valACYclovir (VALTREX) 500 MG tablet Take 1 tablet (500 mg total) by mouth 2 (two) times daily. 6 tablet 3   No current facility-administered medications for this visit.    REVIEW OF SYSTEMS:   All other systems are reviewed and are negative for acute change except as noted in the HPI.  PHYSICAL EXAMINATION: ECOG PERFORMANCE STATUS: 1 -  Symptomatic but completely ambulatory  Vitals:   09/26/23 0938  BP: (!) 146/74  Pulse: 84  Resp: 16  Temp: (!) 97.5 F (36.4 C)  SpO2: 100%   Filed Weights   09/26/23 0938  Weight: 145 lb 14.4 oz (66.2 kg)    Physical Exam Vitals reviewed.  Constitutional:      General: He is not in acute distress.    Appearance: He is not diaphoretic.  HENT:     Head: Normocephalic.     Right Ear: External ear normal.     Nose: Nose normal.     Mouth/Throat:     Mouth: Mucous membranes are moist.     Pharynx: Oropharynx is clear. No oropharyngeal exudate or posterior oropharyngeal erythema.  Eyes:     General: No scleral icterus.    Conjunctiva/sclera: Conjunctivae normal.  Cardiovascular:     Rate and Rhythm: Normal rate and regular rhythm.     Pulses: Normal pulses.     Heart sounds: Normal heart sounds.  Pulmonary:     Effort: Pulmonary effort is normal.     Breath sounds: Normal breath sounds.  Abdominal:     General: There is no distension.     Palpations: Abdomen is soft.  Musculoskeletal:        General: Normal range of motion.     Cervical back: Normal range of motion.  Lymphadenopathy:     Head:     Right side of head: Submental, submandibular and occipital adenopathy present.     Left side of head: Submental, submandibular and occipital adenopathy present.     Cervical: Cervical adenopathy present.     Right cervical: Superficial cervical adenopathy, deep cervical adenopathy and posterior cervical adenopathy present.     Left cervical: Superficial cervical adenopathy, deep cervical adenopathy and posterior cervical adenopathy present.     Upper Body:     Right upper body: Axillary adenopathy present.     Left upper body: Axillary adenopathy present.     Lower Body: Right inguinal adenopathy present. Left inguinal adenopathy present.     Comments: Lymph nodes firm, mobile, non tender. Most range from  2-4 cm  Skin:    General: Skin is warm and dry.     Findings: No  rash.  Neurological:     Mental Status: He is alert and oriented to person, place, and time.       LABORATORY DATA:  I have reviewed the data as listed    Latest Ref Rng & Units 09/26/2023   11:33 AM 09/24/2023   12:43 PM 08/16/2023    3:41 PM  CBC  WBC 4.0 - 10.5 K/uL 3.5  4.5  5.1   Hemoglobin 13.0 - 17.0 g/dL 65.7  84.6  96.2   Hematocrit 39.0 - 52.0 % 42.6  41.9  40.8   Platelets 150 - 400 K/uL 240  237  250        Latest  Ref Rng & Units 09/26/2023   11:33 AM 09/24/2023   12:43 PM 08/16/2023    3:41 PM  CMP  Glucose 70 - 99 mg/dL 95  81  69   BUN 6 - 20 mg/dL 11  11  12    Creatinine 0.61 - 1.24 mg/dL 7.82  9.56  2.13   Sodium 135 - 145 mmol/L 138  136  136   Potassium 3.5 - 5.1 mmol/L 4.1  3.9  3.8   Chloride 98 - 111 mmol/L 105  104  101   CO2 22 - 32 mmol/L 30  25  25    Calcium 8.9 - 10.3 mg/dL 9.5  9.1  9.2   Total Protein 6.5 - 8.1 g/dL 7.7  7.3  6.8   Total Bilirubin <1.2 mg/dL 0.8  0.8  1.1   Alkaline Phos 38 - 126 U/L 72  61  63   AST 15 - 41 U/L 23  29  31    ALT 0 - 44 U/L 13  18  11       RADIOGRAPHIC STUDIES: I have personally reviewed the radiological images as listed and agreed with the findings in the report. CT CHEST ABDOMEN PELVIS W CONTRAST  Result Date: 09/29/2023 CLINICAL DATA:  27 year old male with history of lymphadenopathy. Evaluate for potential lymphoma. * Tracking Code: BO * EXAM: CT CHEST, ABDOMEN, AND PELVIS WITH CONTRAST TECHNIQUE: Multidetector CT imaging of the chest, abdomen and pelvis was performed following the standard protocol during bolus administration of intravenous contrast. RADIATION DOSE REDUCTION: This exam was performed according to the departmental dose-optimization program which includes automated exposure control, adjustment of the mA and/or kV according to patient size and/or use of iterative reconstruction technique. CONTRAST:  OMNIPAQUE IOHEXOL 350 MG/ML SOLN COMPARISON:  No priors. FINDINGS: CT CHEST FINDINGS  Cardiovascular: Heart size is normal. There is no significant pericardial fluid, thickening or pericardial calcification. No atherosclerotic calcifications are noted in the thoracic aorta or the coronary arteries. Mediastinum/Nodes: Mildly enlarged superior mediastinal lymph nodes measuring up to 1 cm in short axis. No other pathologically enlarged mediastinal or hilar lymph nodes are noted. Esophagus is unremarkable in appearance. Extensive axillary lymphadenopathy bilaterally measuring up to 1.7 cm in short axis bilaterally. Lungs/Pleura: No acute consolidative airspace disease. No pleural effusions. No suspicious appearing pulmonary nodules or masses are noted. Musculoskeletal: There are no aggressive appearing lytic or blastic lesions noted in the visualized portions of the skeleton. CT ABDOMEN PELVIS FINDINGS Hepatobiliary: Well-circumscribed 1 cm intermediate to high attenuation (57 HU) lesion in the periphery of segment 8 of the liver (axial image 57 of series 2), incompletely characterized. No other suspicious appearing hepatic lesions are noted. No intra or extrahepatic biliary ductal dilatation. Gallbladder is nearly completely decompressed, but otherwise unremarkable in appearance. Pancreas: No definite pancreatic mass or peripancreatic fluid collections or inflammatory changes are noted on today's noncontrast CT examination. Spleen: Unremarkable. Adrenals/Urinary Tract: Bilateral kidneys and adrenal glands are normal in appearance. No hydroureteronephrosis. Urinary bladder is unremarkable in appearance. Stomach/Bowel: The appearance of the stomach is normal. No pathologic dilatation of small bowel or colon. The appendix is not confidently identified and may be surgically absent. Regardless, there are no inflammatory changes noted adjacent to the cecum to suggest the presence of an acute appendicitis at this time. Vascular/Lymphatic: No aneurysm identified in the visualized abdominal vasculature.  Extensive lymphadenopathy noted throughout the abdomen and pelvis. Specific examples include para-aortic retroperitoneal lymph nodes measuring up to 1.4 cm in short axis (axial image  78 of series 2), pelvic lymphadenopathy measuring up to 1.9 cm in short axis in the right external iliac nodal distribution (axial image 86 of series 2), and inguinal lymphadenopathy bilaterally measuring up to 1.5 cm on the left (axial image 106 of series 2). Innumerable other enlarged and borderline enlarged lymph nodes are noted elsewhere throughout the abdomen and pelvis. Reproductive: Prostate gland and seminal vesicles are unremarkable in appearance. Other: No significant volume of ascites.  No pneumoperitoneum. Musculoskeletal: There are no aggressive appearing lytic or blastic lesions noted in the visualized portions of the skeleton. IMPRESSION: 1. Widespread lymphadenopathy throughout the chest, abdomen and pelvis, as detailed above, highly concerning for underlying lymphoproliferative disease such as leukemia or lymphoma. 2. Indeterminate lesion in segment 8 of the liver. Follow-up nonemergent abdominal MRI with and without IV gadolinium is recommended for definitive characterization in the near future. Electronically Signed   By: Trudie Reed M.D.   On: 09/29/2023 06:52   CT Soft Tissue Neck W Contrast  Result Date: 09/28/2023 CLINICAL DATA:  Malignancy workup.  Lymphadenopathy. EXAM: CT NECK WITH CONTRAST TECHNIQUE: Multidetector CT imaging of the neck was performed using the standard protocol following the bolus administration of intravenous contrast. RADIATION DOSE REDUCTION: This exam was performed according to the departmental dose-optimization program which includes automated exposure control, adjustment of the mA and/or kV according to patient size and/or use of iterative reconstruction technique. CONTRAST:  OMNIPAQUE IOHEXOL 350 MG/ML SOLN COMPARISON:  None Available. FINDINGS: Pharynx and larynx: No  focal mucosal or submucosal lesions are present. The soft palate and tongue base are within normal limits. Palatine tonsils are mildly prominent. Mild prominence of the lingual tonsils noted. No discrete lesion is present. Vallecula and epiglottis are within normal limits. Aryepiglottic folds and piriform sinuses are clear. Vocal cords are midline and symmetric. Trachea is clear. Salivary glands: The submandibular and parotid glands and ducts are within normal limits. Thyroid: Normal. Lymph nodes: Numerous bilateral homogeneous enlarged lymph nodes are present. In index right submental lymph node measures 23 x 16 x 11 mm. 18 mm right submandibular lymph node and 26 mm left submandibular lymph node is present. Enlarged cervical nodes are present bilaterally. A left posterior triangle lymph node measures 25 x 14 x 20 mm. A posterior level 2 nodal mass measures 20 x 20 x 14 mm. No hyperdense or necrotic nodes are present. Bilateral axillary nodes are present. Vascular: No vascular lesions are present. No significant atherosclerotic changes are present. Limited intracranial: Within normal limits Visualized orbits: A remote right orbital blowout fracture is present. The globes and orbits are within normal limits. Scratched at the globes and orbits are otherwise within normal limits. Mastoids and visualized paranasal sinuses: The paranasal sinuses and mastoid air cells are clear. Skeleton: No acute or aggressive process. Upper chest: The lung apices are clear. The thoracic inlet is within normal limits. IMPRESSION: 1. Numerous bilateral homogeneous enlarged lymph nodes throughout the neck as described. Findings are concerning for lymphoproliferative disease such as lymphoma. 2. No hyperdense or necrotic nodes are present. 3. Mild prominence of the palatine and lingual tonsils without discrete lesion. 4. Remote right orbital blowout fracture. Electronically Signed   By: Marin Roberts M.D.   On: 09/28/2023 17:11    DG Chest 2 View  Result Date: 09/24/2023 CLINICAL DATA:  Right neck and chest pain EXAM: CHEST - 2 VIEW COMPARISON:  10/07/2010 FINDINGS: The heart size and mediastinal contours are within normal limits. Both lungs are clear. The visualized skeletal  structures are unremarkable. IMPRESSION: No active cardiopulmonary disease. Electronically Signed   By: Judie Petit.  Shick M.D.   On: 09/24/2023 15:39    ASSESSMENT & PLAN FORTUNE BANDSTRA is a 27 y.o. male presenting [facility] for consultation regarding cervical lymphadenopathy. We have reviewed etiologies including infectious process, inflammatory process, or lymphoproliferative disorder. Patient will proceed with laboratory workup today.   #Generalized lymphadenopathy - Labs collected today include CBC, CMP, ESR, CRP, LDH, flow cytometry, Hep B & C serologies. Will not repeat HIV testing as he had negative test yesterday in the ED. - STAT CT neck and chest/abdomen/pelvis ordered. Patient will call to schedule them today. - ENT referral placed for likely excisional biopsy of cervical node.  #Age related screenings -Based on age none needed.  -Patient will RTC when work up is complete.  Patient expressed understanding of the recommended workup and is agreeable to move forward.   All questions were answered. The patient knows to call the clinic with any problems, questions or concerns.  Shared visit with Dr. Leonides Schanz  Orders Placed This Encounter  Procedures   CT Soft Tissue Neck W Contrast    Standing Status:   Future    Number of Occurrences:   1    Standing Expiration Date:   09/25/2024    Order Specific Question:   If indicated for the ordered procedure, I authorize the administration of contrast media per Radiology protocol    Answer:   Yes    Order Specific Question:   Does the patient have a contrast media/X-ray dye allergy?    Answer:   No    Order Specific Question:   Preferred imaging location?    Answer:    [facility]   CT CHEST ABDOMEN PELVIS W CONTRAST    Standing Status:   Future    Number of Occurrences:   1    Standing Expiration Date:   09/25/2024    Order Specific Question:   If indicated for the ordered procedure, I authorize the administration of contrast media per Radiology protocol    Answer:   Yes    Order Specific Question:   Does the patient have a contrast media/X-ray dye allergy?    Answer:   No    Order Specific Question:   Preferred imaging location?    Answer:   [facility]    Order Specific Question:   If indicated for the ordered procedure, I authorize the administration of oral contrast media per Radiology protocol    Answer:   Yes   CBC with Differential (Cancer Center Only)    Standing Status:   Future    Number of Occurrences:   1    Standing Expiration Date:   09/25/2024   CMP (Cancer Center only)    Standing Status:   Future    Number of Occurrences:   1    Standing Expiration Date:   09/25/2024   Sedimentation rate    Standing Status:   Future    Number of Occurrences:   1    Standing Expiration Date:   09/25/2024   C-reactive protein    Standing Status:   Future    Number of Occurrences:   1    Standing Expiration Date:   09/25/2024   Lactate dehydrogenase (LDH)    Standing Status:   Future    Number of Occurrences:   1    Standing Expiration Date:   09/25/2024  Hepatitis C antibody    Standing Status:   Future    Number of Occurrences:   1    Standing Expiration Date:   09/25/2024   Hepatitis B surface antibody    Standing Status:   Future    Number of Occurrences:   1    Standing Expiration Date:   09/25/2024   Hepatitis B surface antigen    Standing Status:   Future    Number of Occurrences:   1    Standing Expiration Date:   09/25/2024   Hepatitis B core antibody, total    Standing Status:   Future    Number of Occurrences:   1    Standing Expiration Date:   09/25/2024   Flow Cytometry, Peripheral Blood (Oncology)     Standing Status:   Future    Number of Occurrences:   1    Standing Expiration Date:   09/25/2024   Ambulatory referral to ENT    Referral Priority:   Urgent    Referral Type:   Consultation    Referral Reason:   Specialty Services Required    Referred to Provider:   Ashok Croon, MD    Requested Specialty:   Otolaryngology    Number of Visits Requested:   1      I have spent a total of 60 minutes minutes of face-to-face and non-face-to-face time, preparing to see the patient, obtaining and/or reviewing separately obtained history, performing a medically appropriate examination, counseling and educating the patient, ordering medications/tests/procedures, referring and communicating with other health care professionals, documenting clinical information in the electronic health record, independently interpreting results and communicating results to the patient, and care coordination.   Namon Cirri PA-C Department of Hematology/Oncology [facility] at [facility] Phone: [phone]  I have read the above note and personally examined the patient. I agree with the assessment and plan as noted above.  Briefly Mr. Djay Macneal is a 27 year old male who presents for evaluation of widespread lymphadenopathy.  At this time findings are concerning for a lymphoproliferative disorder.  ENT has been consulted for an excisional lymph node biopsy.  Today we will order labs to include ESR, CRP, flow cytometry, CBC, and CMP.  Strong suspicion for lymphoma, differential includes ALL versus Hodgkin's lymphoma.  Given the chronicity of these findings infectious etiology is considerably less likely.  Additionally the patient does not have any signs of an underlying inflammatory disorder.  The patient voiced understanding of our findings and plan moving forward.   Ulysees Barns, MD Department of Hematology/Oncology [facility] at [facility] Phone:  [phone] Pager: [phone] Email: [email or web address]

## 2023-09-26 ENCOUNTER — Inpatient Hospital Stay: Payer: 59 | Attending: Physician Assistant | Admitting: Physician Assistant

## 2023-09-26 ENCOUNTER — Inpatient Hospital Stay: Payer: 59

## 2023-09-26 VITALS — BP 146/74 | HR 84 | Temp 97.5°F | Resp 16 | Wt 145.9 lb

## 2023-09-26 DIAGNOSIS — R591 Generalized enlarged lymph nodes: Secondary | ICD-10-CM | POA: Diagnosis present

## 2023-09-26 DIAGNOSIS — F17290 Nicotine dependence, other tobacco product, uncomplicated: Secondary | ICD-10-CM

## 2023-09-26 DIAGNOSIS — Z803 Family history of malignant neoplasm of breast: Secondary | ICD-10-CM | POA: Diagnosis not present

## 2023-09-26 LAB — CMP (CANCER CENTER ONLY)
ALT: 13 U/L (ref 0–44)
AST: 23 U/L (ref 15–41)
Albumin: 4.7 g/dL (ref 3.5–5.0)
Alkaline Phosphatase: 72 U/L (ref 38–126)
Anion gap: 3 — ABNORMAL LOW (ref 5–15)
BUN: 11 mg/dL (ref 6–20)
CO2: 30 mmol/L (ref 22–32)
Calcium: 9.5 mg/dL (ref 8.9–10.3)
Chloride: 105 mmol/L (ref 98–111)
Creatinine: 1.07 mg/dL (ref 0.61–1.24)
GFR, Estimated: >60 mL/min (ref >60)
Glucose, Bld: 95 mg/dL (ref 70–99)
Potassium: 4.1 mmol/L (ref 3.5–5.1)
Sodium: 138 mmol/L (ref 135–145)
Total Bilirubin: 0.8 mg/dL (ref <1.2)
Total Protein: 7.7 g/dL (ref 6.5–8.1)

## 2023-09-26 LAB — CBC WITH DIFFERENTIAL (CANCER CENTER ONLY)
Abs Immature Granulocytes: 0.01 10*3/uL (ref 0.00–0.07)
Basophils Absolute: 0 10*3/uL (ref 0.0–0.1)
Basophils Relative: 1 %
Eosinophils Absolute: 0 10*3/uL (ref 0.0–0.5)
Eosinophils Relative: 0 %
HCT: 42.6 % (ref 39.0–52.0)
Hemoglobin: 14.2 g/dL (ref 13.0–17.0)
Immature Granulocytes: 0 %
Lymphocytes Relative: 40 %
Lymphs Abs: 1.4 10*3/uL (ref 0.7–4.0)
MCH: 30.2 pg (ref 26.0–34.0)
MCHC: 33.3 g/dL (ref 30.0–36.0)
MCV: 90.6 fL (ref 80.0–100.0)
Monocytes Absolute: 0.3 10*3/uL (ref 0.1–1.0)
Monocytes Relative: 8 %
Neutro Abs: 1.8 10*3/uL (ref 1.7–7.7)
Neutrophils Relative %: 51 %
Platelet Count: 240 10*3/uL (ref 150–400)
RBC: 4.7 MIL/uL (ref 4.22–5.81)
RDW: 11.2 % — ABNORMAL LOW (ref 11.5–15.5)
WBC Count: 3.5 10*3/uL — ABNORMAL LOW (ref 4.0–10.5)
nRBC: 0 % (ref 0.0–0.2)

## 2023-09-26 LAB — LACTATE DEHYDROGENASE: LDH: 178 U/L (ref 98–192)

## 2023-09-26 LAB — HEPATITIS C ANTIBODY: HCV Ab: NONREACTIVE

## 2023-09-26 LAB — HEPATITIS B SURFACE ANTIGEN: Hepatitis B Surface Ag: NONREACTIVE

## 2023-09-26 LAB — HEPATITIS B SURFACE ANTIBODY,QUALITATIVE: Hep B S Ab: REACTIVE — AB

## 2023-09-26 LAB — C-REACTIVE PROTEIN: CRP: 0.6 mg/dL (ref <1.0)

## 2023-09-26 LAB — HEPATITIS B CORE ANTIBODY, TOTAL: Hep B Core Total Ab: NONREACTIVE

## 2023-09-26 LAB — SEDIMENTATION RATE: Sed Rate: 2 mm/h (ref 0–16)

## 2023-09-26 NOTE — Patient Instructions (Addendum)
Diagnostic Clinic Office Visit Discharge Information and Instructions  Thank you for choosing [facility] for your healthcare needs.  Below is a summary of today's discussion, along with our contact information and an outline of what to expect next.  Reason for Visit:  enlarged lymph nodes  Proposed Diagnostic Care Plan: Labs collected today CT scans ordered for your neck and chest, abdomen, and pelvis. I am waiting on insurance to approve these scan. Once approved I will call to let you know. After I call you please call Central Scheduling department to schedule them. The phone number is [phone]. The scan can happen at any of the [facility] facilities. I will call you once I have the scan results. Once I have your CT results we can make plans for the biopsy.  What to Expect: - Generally, when lab tests are ordered the results can take up to 1 week for results to be available.  At that point, we will contact you to discuss your results with you.  Unless there is a critical result, we will typically wait for all of your lab results to be available before contacting you. - If a biopsy is part of your Care Plan, those results can take on average 7-10 days to result.  Once results are available, we will contact you to discuss your pathology results and any next steps. - If you have additional imaging ordered, such as a CT Scan, MRI, Ultrasound, Bone Scan, or PET scan, your imaging will need to be authorized then scheduled with the earliest available appointment.  You may be asked to travel to another hospital within [facility] who has a sooner availability, please consider doing so if asked. - If you use MyChart, your results will be available to you in the MyChart portal.  Your provider will be in touch with you as soon as all of your results are available to be discussed.  Your Diagnostic Clinic Provider:  Namon Cirri PA-C and Dr. Leonides Schanz   If you or your caregiver have number blocking  on your cell phones, please ensure the cancer center's numbers are not blocked.  If you are not a registered MyChart user, please consider enrolling in MyChart to receive your test results and visit notes.  You can also access your discharge instructions electronically.  MyChart also gives you an electronic means to communicate with your Care Team instead of needing to call in to the cancer center.  We appreciate you trusting [place] with your healthcare and look forward to partnering with you as we work to uncover what your potential diagnosis may be.  Please do not hesitate to reach out at any point with questions or concerns.

## 2023-09-27 ENCOUNTER — Ambulatory Visit (INDEPENDENT_AMBULATORY_CARE_PROVIDER_SITE_OTHER): Payer: 59 | Admitting: Otolaryngology

## 2023-09-27 ENCOUNTER — Encounter (INDEPENDENT_AMBULATORY_CARE_PROVIDER_SITE_OTHER): Payer: Self-pay | Admitting: Otolaryngology

## 2023-09-27 VITALS — BP 131/83 | HR 69 | Ht 67.0 in | Wt 144.0 lb

## 2023-09-27 DIAGNOSIS — F17200 Nicotine dependence, unspecified, uncomplicated: Secondary | ICD-10-CM

## 2023-09-27 DIAGNOSIS — F17210 Nicotine dependence, cigarettes, uncomplicated: Secondary | ICD-10-CM | POA: Diagnosis not present

## 2023-09-27 DIAGNOSIS — R590 Localized enlarged lymph nodes: Secondary | ICD-10-CM | POA: Diagnosis not present

## 2023-09-27 LAB — SURGICAL PATHOLOGY

## 2023-09-27 NOTE — Patient Instructions (Signed)
-   call to schedule your CT scans - you will hear from our surgery scheduler to setup your surgery date

## 2023-09-27 NOTE — Progress Notes (Signed)
ENT CONSULT:  Reason for Consult: cervical lymphadenopathy   HPI: Douglas Kline is an 27 y.o. male with hx   Discussed the use of AI scribe software for clinical note transcription with the patient, who gave verbal consent to proceed.  History of Present Illness   The patient, with a known history of HSV-2, presented with bilateral neck swelling noticed approximately six weeks prior to the consultation. The swelling was more prominent on one side and extended to the submandibular region. The patient also reported experiencing night sweats, which he initially attributed to his HSV-2 condition.  Around the same time as the onset of neck swelling, the patient experienced symptoms of a common cold, including postnasal drainage and a stuffy nose. However, these symptoms were not perceived as unusual or severe.  In addition to the neck swelling, the patient also noticed swollen lymph nodes in the groin and underarm areas. Despite a course of antibiotics (Z-Pak), the swelling did not significantly decrease. The patient is a daily smoker.  The patient's blood work, including white cell count and platelet count, were reported as normal. The patient has not experienced any significant weight loss. The patient has not been diagnosed with any other infectious diseases or conditions that could compromise his immune system. HIV/RPR/STD/Hep B/Hep C testing was negative. He had negative CXR 09/24/23. LDH was normal yesterday.       Records Reviewed:  Heme/Onc visit 09/26/23 Douglas Kline 27 y.o. male with self reported history of HSV 2.   On review of the previous records patient presented to UC on 08/16/23 for STD exposure and reported lymph node swelling x 1 week. Physical exam documented with painless, swollen cervical lymph nodes noted throughout, 3 to 4 cm in size. Labs from that same visit showed an elevated LDH 215  and negative HIV, syphilis and STD tests.  He was prescribed Valtrex as he was  concerned about HSV-2 outbreak although denied any vesicular lesions at that time.e.  Patient returned to [facility] on 10/30 for persistent cervical lymphadenopathy. At that visit he had negative mono test and was prescribed a z-pak. Patient had ED work up for neck swelling yesterday which prompted oncology referral. Labs from ED visit were all WNL including CBC, CMP, LDH, and HIV testing negative. Chest xray was also unremarkable.   On exam today patient is accompanied by his mother who provides additional history. Patient reports he first noticed swelling in his neck x 8 weeks ago.  Since onset lymph nodes have been approximately the same size.  Patient noticed a swollen lymph nodes in his underarms and groin about 6 weeks ago he thinks.  He denies any associated pain.  He denies any recent illness.  He reports after taking the Z-Pak there were no changes noted.He denies any fever or weight loss.  He did have night sweats twice last week which is unusual for him.  Denies any current dental pain or infection. Patient admits to social drinking on weekends and smokes marijuana every other day x 5 years. Denies any history of substance abuse or IV drug use.  His family history includes maternal grandfather with colon cancer, paternal grandmother with pancreatic cancer and maternal great grandmother with breast cancer.        History reviewed. No pertinent past medical history.  History reviewed. No pertinent surgical history.  Family History  Problem Relation Age of Onset   Lupus Mother    Hypertension Father     Social History:  reports  that he has been smoking cigars. He has never used smokeless tobacco. He reports current alcohol use. He reports current drug use. Drug: Marijuana.  Allergies:  Allergies  Allergen Reactions   Morphine And Codeine Hives    Medications: I have reviewed the patient's current medications.  The PMH, PSH, Medications, Allergies, and SH were reviewed and  updated.  ROS: Constitutional: Negative for fever, weight loss and weight gain. Cardiovascular: Negative for chest pain and dyspnea on exertion. Respiratory: Is not experiencing shortness of breath at rest. Gastrointestinal: Negative for nausea and vomiting. Neurological: Negative for headaches. Psychiatric: The patient is not nervous/anxious  Blood pressure 131/83, pulse 69, height 5\' 7"  (1.702 m), weight 144 lb (65.3 kg), SpO2 99%.  PHYSICAL EXAM:  Exam: General: Well-developed, well-nourished Respiratory Respiratory effort: Equal inspiration and expiration without stridor Cardiovascular Peripheral Vascular: Warm extremities with equal color/perfusion Eyes: No nystagmus with equal extraocular motion bilaterally Neuro/Psych/Balance: Patient oriented to person, place, and time; Appropriate mood and affect; Gait is intact with no imbalance; Cranial nerves I-XII are intact Head and Face Inspection: Normocephalic and atraumatic without mass or lesion Palpation: Facial skeleton intact without bony stepoffs Salivary Glands: No mass or tenderness Facial Strength: Facial motility symmetric and full bilaterally ENT Pinna: External ear intact and fully developed External canal: Canal is patent with intact skin Tympanic Membrane: Clear and mobile External Nose: No scar or anatomic deformity Internal Nose: Septum is deviated to the left. No polyp, or purulence. Mucosal edema and erythema present.  Bilateral inferior turbinate hypertrophy.  Lips, Teeth, and gums: Mucosa and teeth intact and viable TMJ: No pain to palpation with full mobility Oral cavity/oropharynx: No erythema or exudate, no lesions present. 2+ tonsils. Nasopharynx: No mass or lesion with intact mucosa Hypopharynx: Intact mucosa without pooling of secretions Larynx Glottic: Full true vocal cord mobility without lesion or mass Supraglottic: Normal appearing epiglottis and AE folds Interarytenoid Space: No or minimal  pachydermia or edema Subglottic Space: Patent without lesion or edema Neck Neck and Trachea: Midline trachea without mass or lesion Thyroid: No mass or nodularity Lymphatics: palpable cervical lymphadenopathy level I-II area along upper SCM area, no overlying skin changes,no tenderness to palpation.  B/l axillary lymphadenopathy  Procedure:  Preoperative diagnosis: cervical lymphadenopathy  Postoperative diagnosis:   Same + no lesions or masses noted on flexible scope exam  Procedure: Flexible fiberoptic laryngoscopy  Surgeon: Ashok Croon, MD  Anesthesia: Topical lidocaine and Afrin Complications: None Condition is stable throughout exam  Indications and consent:  The patient presents to the clinic with Indirect laryngoscopy view was incomplete. Thus it was recommended that they undergo a flexible fiberoptic laryngoscopy. All of the risks, benefits, and potential complications were reviewed with the patient preoperatively and verbal informed consent was obtained.  Procedure: The patient was seated upright in the clinic. Topical lidocaine and Afrin were applied to the nasal cavity. After adequate anesthesia had occurred, I then proceeded to pass the flexible telescope into the nasal cavity. The nasal cavity was patent without rhinorrhea or polyp. The nasopharynx was also patent without mass or lesion. The base of tongue was visualized and was normal. There were no signs of pooling of secretions in the piriform sinuses. The true vocal folds were mobile bilaterally. There were no signs of glottic or supraglottic mucosal lesion or mass. There was moderate interarytenoid pachydermia and post cricoid edema. The telescope was then slowly withdrawn and the patient tolerated the procedure throughout.     Studies Reviewed: CXR 09/24/23  Negative -  personally reviewed.   Labs Recent Results (from the past 2160 hour(s))  CBC with Differential     Status: Abnormal   Collection Time:  08/16/23  3:41 PM  Result Value Ref Range   WBC 5.1 4.0 - 10.5 K/uL   RBC 4.42 4.22 - 5.81 MIL/uL   Hemoglobin 13.7 13.0 - 17.0 g/dL   HCT 19.1 47.8 - 29.5 %   MCV 92.3 80.0 - 100.0 fL   MCH 31.0 26.0 - 34.0 pg   MCHC 33.6 30.0 - 36.0 g/dL   RDW 62.1 (L) 30.8 - 65.7 %   Platelets 250 150 - 400 K/uL    Comment: REPEATED TO VERIFY   nRBC 0.0 0.0 - 0.2 %   Neutrophils Relative % 53 %   Neutro Abs 2.8 1.7 - 7.7 K/uL   Lymphocytes Relative 33 %   Lymphs Abs 1.7 0.7 - 4.0 K/uL   Monocytes Relative 12 %   Monocytes Absolute 0.6 0.1 - 1.0 K/uL   Eosinophils Relative 1 %   Eosinophils Absolute 0.0 0.0 - 0.5 K/uL   Basophils Relative 1 %   Basophils Absolute 0.0 0.0 - 0.1 K/uL   Immature Granulocytes 0 %   Abs Immature Granulocytes 0.01 0.00 - 0.07 K/uL    Comment: Performed at [facility], [address], [place], [place] [address]  Comprehensive metabolic panel     Status: Abnormal   Collection Time: 08/16/23  3:41 PM  Result Value Ref Range   Sodium 136 135 - 145 mmol/L   Potassium 3.8 3.5 - 5.1 mmol/L   Chloride 101 98 - 111 mmol/L   CO2 25 22 - 32 mmol/L   Glucose, Bld 69 (L) 70 - 99 mg/dL    Comment: Glucose reference range applies only to samples taken after fasting for at least 8 hours.   BUN 12 6 - 20 mg/dL   Creatinine, Ser 2.95 0.61 - 1.24 mg/dL   Calcium 9.2 8.9 - 28.4 mg/dL   Total Protein 6.8 6.5 - 8.1 g/dL   Albumin 4.0 3.5 - 5.0 g/dL   AST 31 15 - 41 U/L   ALT 11 0 - 44 U/L   Alkaline Phosphatase 63 38 - 126 U/L   Total Bilirubin 1.1 0.3 - 1.2 mg/dL   GFR, Estimated >13 >24 mL/min    Comment: (NOTE) Calculated using the CKD-EPI Creatinine Equation (2021)    Anion gap 10 5 - 15    Comment: Performed at [facility], [address] [place], [place] [address]  Lactate dehydrogenase     Status: Abnormal   Collection Time: 08/16/23  3:41 PM  Result Value Ref Range   LDH 215 (H) 98 - 192 U/L    Comment: Performed at [facility], [address], [place], [place] [address]  HIV Antibody (routine testing w rflx)     Status: None   Collection Time: 08/16/23  3:41 PM  Result Value Ref Range   HIV Screen 4th Generation wRfx Non Reactive Non Reactive    Comment: Performed at [facility], [address], [place], [place] [address]  RPR     Status: None   Collection Time: 08/16/23  3:41 PM  Result Value Ref Range   RPR Ser Ql NON REACTIVE NON REACTIVE    Comment: Performed at [facility], [address] [place], [place] [address]  Cytology Ancillary Only -     Status: None  Collection Time: 08/16/23  3:52 PM  Result Value Ref Range   Neisseria Gonorrhea Negative    Chlamydia Negative    Trichomonas Negative    Comment Normal Reference Range Trichomonas - Negative    Comment Normal Reference Ranger Chlamydia - Negative    Comment      Normal Reference Range Neisseria Gonorrhea - Negative  POC mono screen     Status: None   Collection Time: 09/12/23  4:31 PM  Result Value Ref Range   Mono, POC Negative Negative  GC/Chlamydia probe amp ([place]) not at [facility]     Status: None   Collection Time: 09/24/23 12:22 PM  Result Value Ref Range   Neisseria Gonorrhea Negative    Chlamydia Negative    Comment Normal Reference Ranger Chlamydia - Negative    Comment      Normal Reference Range Neisseria Gonorrhea - Negative  CBC with Differential     Status: None   Collection Time: 09/24/23 12:43 PM  Result Value Ref Range   WBC 4.5 4.0 - 10.5 K/uL   RBC 4.56 4.22 - 5.81 MIL/uL   Hemoglobin 14.0 13.0 - 17.0 g/dL   HCT 86.5 78.4 - 69.6 %   MCV 91.9 80.0 - 100.0 fL   MCH 30.7 26.0 - 34.0 pg   MCHC 33.4 30.0 - 36.0 g/dL   RDW 29.5 28.4 - 13.2 %   Platelets 237 150 - 400 K/uL   nRBC 0.0 0.0 - 0.2 %   Neutrophils Relative % 54 %   Neutro Abs 2.4 1.7 - 7.7 K/uL   Lymphocytes Relative 36 %   Lymphs Abs 1.6 0.7 - 4.0 K/uL   Monocytes Relative 9 %   Monocytes Absolute 0.4 0.1 - 1.0 K/uL   Eosinophils Relative 0 %    Eosinophils Absolute 0.0 0.0 - 0.5 K/uL   Basophils Relative 1 %   Basophils Absolute 0.0 0.0 - 0.1 K/uL   Immature Granulocytes 0 %   Abs Immature Granulocytes 0.01 0.00 - 0.07 K/uL    Comment: Performed at [facility], [address], [place], [place] [address]  Comprehensive metabolic panel     Status: None   Collection Time: 09/24/23 12:43 PM  Result Value Ref Range   Sodium 136 135 - 145 mmol/L   Potassium 3.9 3.5 - 5.1 mmol/L   Chloride 104 98 - 111 mmol/L   CO2 25 22 - 32 mmol/L   Glucose, Bld 81 70 - 99 mg/dL    Comment: Glucose reference range applies only to samples taken after fasting for at least 8 hours.   BUN 11 6 - 20 mg/dL   Creatinine, Ser 2.72 0.61 - 1.24 mg/dL   Calcium 9.1 8.9 - 53.6 mg/dL   Total Protein 7.3 6.5 - 8.1 g/dL   Albumin 4.4 3.5 - 5.0 g/dL   AST 29 15 - 41 U/L   ALT 18 0 - 44 U/L   Alkaline Phosphatase 61 38 - 126 U/L   Total Bilirubin 0.8 <1.2 mg/dL   GFR, Estimated >64 >40 mL/min    Comment: (NOTE) Calculated using the CKD-EPI Creatinine Equation (2021)    Anion gap 7 5 - 15    Comment: Performed at [facility], [address] [place], [place] [address]  HIV Antibody (routine testing w rflx)     Status: None   Collection Time: 09/24/23 12:43 PM  Result Value Ref Range   HIV Screen 4th Generation wRfx Non  Reactive Non Reactive    Comment: Performed at [facility], [address] [place], [place] [address]  Lactate dehydrogenase     Status: None   Collection Time: 09/24/23 12:43 PM  Result Value Ref Range   LDH 170 98 - 192 U/L    Comment: Performed at [facility], [address], [place], [place] [address]  Hepatitis B core antibody, total     Status: None   Collection Time: 09/26/23 11:33 AM  Result Value Ref Range   Hep B Core Total Ab NON REACTIVE NON REACTIVE    Comment: Performed at [facility], [address] [place], [place] [address]  Hepatitis B surface  antigen     Status: None   Collection Time: 09/26/23 11:33 AM  Result Value Ref Range   Hepatitis B Surface Ag NON REACTIVE NON REACTIVE    Comment: Performed at [facility], [address], [place], [place] [address]  Hepatitis B surface antibody     Status: Abnormal   Collection Time: 09/26/23 11:33 AM  Result Value Ref Range   Hep B S Ab Reactive (A) NON REACTIVE    Comment: (NOTE) Consistent with immunity, greater than 9.9 mIU/mL.  Performed at [facility], [address] [place], [place] [address]   Hepatitis C antibody     Status: None   Collection Time: 09/26/23 11:33 AM  Result Value Ref Range   HCV Ab NON REACTIVE NON REACTIVE    Comment: (NOTE) Nonreactive HCV antibody screen is consistent with no HCV infections,  unless recent infection is suspected or other evidence exists to indicate HCV infection.  Performed at [facility], [address], [place], [place] [address]   Lactate dehydrogenase (LDH)     Status: None   Collection Time: 09/26/23 11:33 AM  Result Value Ref Range   LDH 178 98 - 192 U/L    Comment: Performed at [facility], [address], [place], [place] [address]  C-reactive protein     Status: None   Collection Time: 09/26/23 11:33 AM  Result Value Ref Range   CRP 0.6 <1.0 mg/dL    Comment: Performed at [facility], [address] [place], [place] [address]  Sedimentation rate     Status: None   Collection Time: 09/26/23 11:33 AM  Result Value Ref Range   Sed Rate 2 0 - 16 mm/hr    Comment: Performed at [facility], [address] [place], [place] [address]  CMP (Cancer Center only)     Status: Abnormal   Collection Time: 09/26/23 11:33 AM  Result Value Ref Range   Sodium 138 135 - 145 mmol/L   Potassium 4.1 3.5 - 5.1 mmol/L   Chloride 105 98 - 111 mmol/L   CO2 30 22 - 32 mmol/L   Glucose, Bld 95 70 - 99 mg/dL    Comment: Glucose reference range applies only to  samples taken after fasting for at least 8 hours.   BUN 11 6 - 20 mg/dL   Creatinine 2.44 0.10 - 1.24 mg/dL   Calcium 9.5 8.9 - 27.2 mg/dL   Total Protein 7.7 6.5 - 8.1 g/dL   Albumin 4.7 3.5 - 5.0 g/dL   AST 23 15 - 41 U/L   ALT 13 0 - 44 U/L   Alkaline Phosphatase 72 38 - 126 U/L   Total Bilirubin 0.8 <1.2 mg/dL  GFR, Estimated >60 >60 mL/min    Comment: (NOTE) Calculated using the CKD-EPI Creatinine Equation (2021)    Anion gap 3 (L) 5 - 15    Comment: Performed at [facility], [address], [place], [place] [address]  CBC with Differential (Cancer Center Only)     Status: Abnormal   Collection Time: 09/26/23 11:33 AM  Result Value Ref Range   WBC Count 3.5 (L) 4.0 - 10.5 K/uL   RBC 4.70 4.22 - 5.81 MIL/uL   Hemoglobin 14.2 13.0 - 17.0 g/dL   HCT 60.4 54.0 - 98.1 %   MCV 90.6 80.0 - 100.0 fL   MCH 30.2 26.0 - 34.0 pg   MCHC 33.3 30.0 - 36.0 g/dL   RDW 19.1 (L) 47.8 - 29.5 %   Platelet Count 240 150 - 400 K/uL   nRBC 0.0 0.0 - 0.2 %   Neutrophils Relative % 51 %   Neutro Abs 1.8 1.7 - 7.7 K/uL   Lymphocytes Relative 40 %   Lymphs Abs 1.4 0.7 - 4.0 K/uL   Monocytes Relative 8 %   Monocytes Absolute 0.3 0.1 - 1.0 K/uL   Eosinophils Relative 0 %   Eosinophils Absolute 0.0 0.0 - 0.5 K/uL   Basophils Relative 1 %   Basophils Absolute 0.0 0.0 - 0.1 K/uL   Immature Granulocytes 0 %   Abs Immature Granulocytes 0.01 0.00 - 0.07 K/uL    Comment: Performed at [facility], [address] [place], [place] [address]    Assessment/Plan: Encounter Diagnoses  Name Primary?   Cervical lymphadenopathy Yes   Inguinal adenopathy    Axillary adenopathy    Tobacco use disorder     Assessment and Plan    Persistent Cervical Lymphadenopathy x 6-8 weeks Bilateral cervical lymphadenopathy for six weeks with night sweats but no significant weight loss. Also has inguinal and axillary lymph nodes. No history of infections or  immunocompromising conditions. Initial azithromycin treatment was ineffective. Physical exam reveals palpable lymph nodes in neck, axilla, and groin. Normal white count and platelet count. Differential includes infectious causes, lymphoma, and other malignancies. Discussed excisional biopsy for definitive diagnosis, including procedure details and potential risks such as hypertrophic or keloid scarring, particularly in African American individuals. Emphasized need for CT scans prior to biopsy. - has an active order for CT scan of neck, chest, abdomen, and pelvis - provided the number to call and schedule - Perform excisional biopsy of cervical lymph node - Send biopsy for pathology to check for infections, lymphoma, and other malignancies - Schedule follow-up appointment post-biopsy to discuss results  Herpes Simplex Virus Type 2 (HSV-2) Known HSV-2 with genital lesions. No current active lesions and did not have any when developed lymphadenopathy, which is unlikely related to HSV-2. - Monitor for new or recurrent HSV-2 lesions - Continue current management for HSV-2 as needed - testing for STDs and HIV negative. Negative testing for Hep B/C.    Smoking Daily smoker. Smoking is a risk factor for various conditions, including malignancies. Discussed that smoking typically takes years to cause throat cancer but remains a risk factor. - Discussed smoking cessation options and provided resources  General Health Maintenance Discussed in context of current symptoms and diagnostic workup. - Provided anticipatory guidance regarding potential outcomes of biopsy and CT scans - Discuss importance of follow-up and proactive scheduling of diagnostic tests  Follow-up - Call radiology to schedule CT scans - Expect a call from surgery scheduler to  set up biopsy date - Follow-up appointment post-biopsy to discuss results.        Thank you for allowing me to participate in the care of this patient.  Please do not hesitate to contact me with any questions or concerns.   Ashok Croon, MD Otolaryngology [facility] Phone: [phone] Fax: [phone]    09/27/2023, 12:15 PM

## 2023-09-28 ENCOUNTER — Ambulatory Visit (HOSPITAL_BASED_OUTPATIENT_CLINIC_OR_DEPARTMENT_OTHER)
Admission: RE | Admit: 2023-09-28 | Discharge: 2023-09-28 | Disposition: A | Discharge status: Home / Self Care | Payer: 59 | Source: Ambulatory Visit | Attending: Physician Assistant

## 2023-09-28 DIAGNOSIS — R591 Generalized enlarged lymph nodes: Secondary | ICD-10-CM | POA: Diagnosis present

## 2023-09-28 MED ORDER — IOHEXOL 350 MG/ML SOLN
100.0000 mL | Freq: Once | INTRAVENOUS | Status: AC | PRN
  Administered 2023-09-28: 100 mL via INTRAVENOUS

## 2023-09-29 LAB — FLOW CYTOMETRY

## 2023-10-02 ENCOUNTER — Telehealth: Payer: Self-pay | Admitting: Physician Assistant

## 2023-10-02 NOTE — Telephone Encounter (Signed)
Attempted to call patient to discuss results of labs and CT scans without success. Per chart review ENT provider is planning for excisional biopsy of cervical lymph node. I did speak with patient last week and he reported waiting on insurance to approve the procedure. I will attempt to call patient again tomorrow morning.

## 2023-10-03 ENCOUNTER — Encounter: Payer: Self-pay | Admitting: Physician Assistant

## 2023-10-04 ENCOUNTER — Institutional Professional Consult (permissible substitution) (INDEPENDENT_AMBULATORY_CARE_PROVIDER_SITE_OTHER): Payer: 59 | Admitting: Otolaryngology

## 2023-10-04 ENCOUNTER — Institutional Professional Consult (permissible substitution) (INDEPENDENT_AMBULATORY_CARE_PROVIDER_SITE_OTHER): Payer: 59

## 2023-10-05 ENCOUNTER — Telehealth: Payer: Self-pay | Admitting: Physician Assistant

## 2023-10-05 NOTE — Telephone Encounter (Signed)
Attempted to reach patient by his phone number and phone goes directly to voicemail. MyChart message sent yesterday to patient and he responded his phone is not working currently and he will have his mother call to discuss results of CT scan. As of now mother has not called and I attempted to reach her at the number listed in the chart however that is a work phone number and she no longer works there.   Chart review shows patient has not scheduled the cervical excisional biopsy with ENT which is the next step in his diagnostic work up. Will continue trying to contact patient and/or mother.

## 2023-10-05 NOTE — Telephone Encounter (Signed)
I notified Douglas Kline mother by phone regarding CT CAP results after being given permission by patient via a MyChart message. The CT is showing widespread lymphadenopathy throughout the chest, abdomen and pelvis as well as an indeterminate lesion in his liver. Mother will call ENT office to help schedule his biopsy. After discussion with Dr. Leonides Schanz we plan to follow up on liver lesion after the biopsy once we are able to determine diagnosis. All questions were answered and she expressed understanding of the plan provided. Patient's phone is not working at this time and she asks to be contacted for everything regarding this workup.

## 2023-10-09 ENCOUNTER — Other Ambulatory Visit: Payer: Self-pay

## 2023-10-09 ENCOUNTER — Other Ambulatory Visit (HOSPITAL_COMMUNITY)
Admission: RE | Admit: 2023-10-09 | Discharge: 2023-10-09 | Disposition: A | Discharge status: Home / Self Care | Payer: 59 | Source: Ambulatory Visit | Attending: Otolaryngology | Admitting: Otolaryngology

## 2023-10-09 DIAGNOSIS — R599 Enlarged lymph nodes, unspecified: Secondary | ICD-10-CM | POA: Diagnosis present

## 2023-10-15 ENCOUNTER — Other Ambulatory Visit (INDEPENDENT_AMBULATORY_CARE_PROVIDER_SITE_OTHER): Payer: Self-pay | Admitting: Otolaryngology

## 2023-10-15 ENCOUNTER — Telehealth (INDEPENDENT_AMBULATORY_CARE_PROVIDER_SITE_OTHER): Payer: Self-pay | Admitting: Otolaryngology

## 2023-10-15 DIAGNOSIS — R590 Localized enlarged lymph nodes: Secondary | ICD-10-CM

## 2023-10-15 NOTE — Telephone Encounter (Signed)
Spoke with Rhunette Croft, patient's mother, who informed me that the patient's phone is not working, also new phone number is [phone] (currently out of order), we discussed the next step in his care which is scheduling f/u with Dr Leonides Schanz and his team for further evaluation and management, we discussed that preliminary results of excisional lymph node bx concerning for hematologic malignancy. They will get in touch with Dr Derek Mound office to schedule a f/u.

## 2023-10-15 NOTE — Progress Notes (Signed)
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

## 2023-10-16 NOTE — Progress Notes (Signed)
Received referral to see Dr Leonides Schanz. Was seen by Jae Dire, NP on 11/13 with Dr Leonides Schanz, referred to ENT for biopsy on 11/26. Asked by Dr Leonides Schanz to have Jae Dire see on 12/11, 12 or 13 due to Dr Leonides Schanz being out of town. Will continue to monitor.

## 2023-10-17 LAB — SURGICAL PATHOLOGY

## 2023-10-18 ENCOUNTER — Telehealth: Payer: Self-pay | Admitting: *Deleted

## 2023-10-18 ENCOUNTER — Telehealth: Payer: Self-pay | Admitting: Physician Assistant

## 2023-10-18 NOTE — Telephone Encounter (Signed)
I notified Merl Szczepaniak Newmark's mother Nyziah Shibuya by phone regarding surgical biopsy results. 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 Mother requests treatment at [facility]. Urgent referral was sent by nursing staff. All of mother's questions were answered and she expressed understanding of the plan provided. Appointment for 10/26/23 here at [facility] is no longer needed and has been cancelled.

## 2023-10-18 NOTE — Telephone Encounter (Signed)
Fax'd urgent referral to [facility]  [phone]

## 2023-10-19 NOTE — Progress Notes (Signed)
Referral received on 10/16/23, discussed with Dr Leonides Schanz and was scheduled with Jae Dire and Dr Leonides Schanz would see with her. Was scheduled on 10/26/23, but was canceled after biopsy results posted and patient was urgently referred to [facility]. Will continue to monitor.

## 2023-10-22 ENCOUNTER — Ambulatory Visit (INDEPENDENT_AMBULATORY_CARE_PROVIDER_SITE_OTHER): Payer: 59 | Admitting: Otolaryngology

## 2023-10-26 ENCOUNTER — Ambulatory Visit: Payer: 59 | Admitting: Hematology and Oncology

## 2023-10-26 ENCOUNTER — Other Ambulatory Visit: Payer: 59

## 2024-05-26 NOTE — Progress Notes (Signed)
 Hematologic Malignancies [facility] Cellular Therapy Clinic Return Evaluation   NAME: Douglas Kline MEDICAL RECORD NUMBER: [id number] DATE OF BIRTH: 09/07/1996  DATE OF SERVICE: 06/02/2024  IDENTIFYING STATEMENT: Douglas Kline is a 28 y.o. 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  INTERVAL EVENTS: [name] presents for follow-up. His sister was in the infusion room with him today. He is doing well with no new symptoms. His platelets are low today, though not yet at transfusion threshold, so we will have him return in a few days to recheck. He mostly had questions about the significance of the TP53 mutation in his NGS, and we again discussed considerations for allo-SCT as he is now leaning towards it.   He denies chest pain, shortness of breath, light headedness, palpitations, syncope, asymmetric weakness or numbness, vision changes, abdominal pain, nausea/vomiting/diarrhea, bleeding, dysuria, cough, fevers, weight loss, rash. Review of systems otherwise negative unless noted above.  ONCOLOGIC HISTORY:  - 08/16/23 - evaluated in urgent care given concern for STD exposure and noted to have cervical LN swelling x 1 week. LDH 215. STD testing was negative. - 09/12/23 - re-presented to urgent care for persistent cervical LAD, had negative mono test, and prescribed z-pak - 09/25/23 - presented to ED for neck swelling, prompting oncology referral, reportedly had normal CBC - 09/26/23 - established care with Dr. 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 Flow cytometry showed minor CD34+ blastic population.  - 10/24/23 - established care at [facility] referred by Dr. Federico - 10/24/23 - BMBX with 30% blasts (T-ALL/LBL), FISH with monosomy 9, NGS with RUNX1 mutation, heme fusion panel negative - 10/26/23 - MRI brain with scattered enlarged LNs in R retropharyngeal space, bilateral parotid glands, and cervical stations 2 and 5 - 10/26/23 - PETCT with hypermetabolic LAD above and below diaphragm - 10/30/23 - CALGB 89596, Course I - 11/26/23 - BMBX with 0.8% residual phenotypically abnormal T-lymphoblasts, +ClonoSeq ([id number] residual cells per million) - 12/05/23 - PETCT with marked improvement in hypermetabolic adenopathy throughout, clustered new LUL pulmonary nodules favored to be infectious/inflammatory - 12/11/23 - CALGB 89596, Course II - 01/29/24 - BMBX initially read as 12% blasts but later revised as felt these blasts were from a reconstituting marrow, and was corrected to no detectable abnormal blasts with +ClonoSeq (1,074 residual cells per million) - 02/12/24 - CALGB 89596, Course III (briefly interrupted due to concern for relapse but hemepath read corrected as above) - 03/31/24 - PETCT negative - 04/01/24 - BMBX negative, +ClonoSeq (420 residual cells per million) - 04/09/24 - CALGB 89596, Course IV  PAST MEDICAL HISTORY:  Past Medical History:  Diagnosis Date  . Chlamydia   . G6PD deficiency   . Herpes    PAST SURGICAL HISTORY:  Past Surgical History:  Procedure Laterality Date  . TONSILLECTOMY & ADENOIDECTOMY     CURRENT MEDICATIONS: Current Outpatient Medications  Medication Sig Dispense Refill  . acyclovir (ZOVIRAX) 400 MG tablet Take 1 tablet (400 mg total) by mouth 2 (two) times daily 60 tablet 11  . famotidine (PEPCID) 10 MG tablet Take 1 tablet (10 mg total) by  mouth 2 (two) times daily as needed for Heartburn (Patient not taking: Reported on 06/02/2024) 60 tablet 2  . metoclopramide (REGLAN) 5 MG tablet  Take 2 tablets (10 mg total) by mouth 3 (three) times daily as needed (Patient not taking: Reported on 06/02/2024) 180 tablet 1  . ondansetron  (ZOFRAN ) 8 MG tablet Take 1 tablet (8 mg total) by mouth every 12 (twelve) hours as needed for nausea or vomiting 60 tablet 2  . pentamidine (NEBUPENT) 300 mg solution for nebulization Take 6 mLs (300 mg total) by nebulization every 28 (twenty-eight) days Inhalation in clinic due to G6PD deficiency - next due ~1/16    . polyethylene glycol (MIRALAX) packet Take 1 packet (17 g total) by mouth 2 (two) times daily Purchase over the counter Mix in 4-8ounces of fluid prior to taking.    SABRA scopolamine (TRANSDERM-SCOP) 1 mg over 3 days patch Place 1 patch (1 mg total) onto the skin every third day 10 patch 1  . sennosides-docusate (SENOKOT-S) 8.6-50 mg tablet Take 2 tablets by mouth 2 (two) times daily Purchase over the counter    . thioguanine (TABLOID) 40 mg tablet Take 3 tablets (120 mg total) by mouth at bedtime for 14 days. Take on days 29 to 42 42 tablet 0   No current facility-administered medications for this visit.   Facility-Administered Medications Ordered in Other Visits  Medication Dose Route Frequency Provider Last Rate Last Admin  . OK to Treat: Nurse   XX Once Melia Gander, MD       ALLERGIES: Allergies  Allergen Reactions  . Aspirin  Other (See Comments)    G6PD deficiency  . Bactrim [Sulfamethoxazole-Trimethoprim] Other (See Comments)    G6PD deficiency  . Dapsone Other (See Comments)    G6PD deficiency  . Levaquin [Levofloxacin] Other (See Comments)    G6PD deficiency  . Morphine Hives  . Nitrofurantoin Other (See Comments)    G6PD deficiency  . Quinidine Other (See Comments)    G6PD deficiency  . Rasburicase Other (See Comments)    G6PD deficiency   PSYCHO-SOCIAL HISTORY: Social History   Socioeconomic History  . Marital status: Single  Tobacco Use  . Smoking status: Never  . Smokeless tobacco: Never  Vaping Use  . Vaping  status: Former  Substance and Sexual Activity  . Alcohol use: Not Currently  . Drug use: Yes    Types: Other-see comments, Marijuana    Comment: Daily Marijuana smoker  . Sexual activity: Yes   Social Drivers of Health   Financial Resource Strain: Medium Risk (10/24/2023)   Overall Financial Resource Strain (CARDIA)   . Difficulty of Paying Living Expenses: Somewhat hard  Food Insecurity: No Food Insecurity (10/24/2023)   Hunger Vital Sign   . Worried About Programme researcher, broadcasting/film/video in the Last Year: Never true   . Ran Out of Food in the Last Year: Never true  Transportation Needs: Unknown (10/24/2023)   PRAPARE - Transportation   . Lack of Transportation (Medical): No  Housing Stability: High Risk (03/10/2024)   Housing Stability Vital Sign   . Unable to Pay for Housing in the Last Year: Yes   . Number of Times Moved in the Last Year: 1   . Homeless in the Last Year: No   FAMILY HISTORY: Family History  Problem Relation Name Age of Onset  . No Known Problems Mother    . No Known Problems Father    . Pancreatic cancer Maternal Grandmother    . Breast  cancer Paternal Grandmother    . Colon cancer Paternal Grandfather      Objective:    PHYSICAL EXAMINATION: There were no vitals filed for this visit. There is no height or weight on file to calculate BSA. See treatment room encounter for vitals.  GEN: well-appearing, mentating well HEENT: sclera anicteric LYMPH: palpable axillary and cervical LAD CV: RRR, no m/r/g PULM: CTAB, no crackles or wheezing, nl WOB ABD: soft, NTND, BS present EXT: no LE edema, warm ext NEURO: no gross deficits  ECOG performance status: 0 - Asymptomatic  LABORATORY STUDIES: Lab Results  Component Value Date   WBC 1.0 (L) 06/02/2024   HGB 8.2 (L) 06/02/2024   HCT 24.0 (L) 06/02/2024   PLT 13 (LL) 06/02/2024   Lab Results  Component Value Date   NA 134 (L) 06/02/2024   K 4.0 06/02/2024   CL 103 06/02/2024   CO2 26 06/02/2024   BUN 9  06/02/2024   CREATININE 0.8 06/02/2024   GLUCOSE 90 06/02/2024   Lab Results  Component Value Date   AST 23 06/02/2024   ALT 50 06/02/2024   ALKPHOS 71 06/02/2024   TBILI 0.8 06/02/2024   CONJBILI 0.1 11/20/2023   ALB 3.7 06/02/2024   TOTALPROTEIN 6.6 06/02/2024    Impression/Plan:   PROBLEMS: T-cell Acute Lymphoblastic Leukemia/Lymphoma, monosomy 9 Encounter for antineoplastic chemotherapy  IMPRESSION: Mr. Torosyan is a 28 y.o. gentleman with T-cell ALL/LBL currently on CALGB 89596. He has now completed induction, which he tolerated well. His PET scan showed a complete remission after induction but he continues to have MRD.   This is a challenging situation as he has had a relatively favorable response to 10403. However, there is persistent MRD by ClonoSeq which raises the question of how durable his response will be, especially if we move towards maintenance. Next line therapy for T-ALL is limited and would typically involve a nelarabine-based regimen followed by allogeneic SCT. One option, given his young age, would be to proceed with allogeneic SCT soon in order to consolidate his response and give him the highest chance for a cure, but this does come with corresponding risk related to an allogeneic SCT. He is currently receiving course IV so we will recheck his ClonoSeq after the completion of chemotherapy.  RECOMMENDATIONS/PLAN: - Continue course IV of CALGB 10403   - PPX: acyclovir, pentamidine (last dose 05/28/24) - schedule next on 06/25/24 - He has a G6PD deficiency - Normal TPMT activity - He is undergoing allogeneic SCT evaluation with Dr. Wilfred - Follows with palliative care for nausea [ ]  repeat BMBX with ClonoSeq on 06/09/24 with Bernarda Carbon  Return 06/19/24 to see Dr. Melia as URP to review bone marrow biopsy results  He verbalizes understanding of the plan and is in agreement. He denies any questions or concerns at this time but does know to call the office in the  interim should any issues arise. All questions were answered to their satisfaction.    Laboratory studies were reviewed with him today and his medications were reconciled and updated with a copy of their After Visit Summary provided to him.   Attestation Statement:   I personally performed the service. (TP)  DORLA MELIA, MD  Assistant Professor of Medicine Division of Hematologic Malignancies and Cellular Therapy [employer] Immuno-Oncology, [facility]

## 2024-05-29 NOTE — Progress Notes (Signed)
  Nursing Documentation  Overview Patient is a 28 y.o. male who presents for D39C4 of cytarabine. Labs reviewed with RN/treatment plan guidelines. No premedications given per orders. Chemotherapy dosages and patient ID confirmed independently by 2 competent nurses prior to Chemotherapy  administration.  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 Patient: [] Complains of neuropathy located  [] Complains of worsening neuropathy located     Interventions [] Provider notified [] Education provided [] No interventions required    Neurological Assessment [x] WDL Patient is: [] Lethargic [] Alert and disoriented [] Sedated [] Other    Cardiopulmonary Assessment [x] WDL Patient:  [] Complains of SOB upon exertion [] Complains of SOB at rest [] Complains of chest pain [] Hypertensive [] Hypotensive  Patient: [] Skin color is flushed [] Is cyanotic [] Is diaphoretic [] Capillary refill less than 3 seconds [] Capillary refill more than 3 seconds [] Heart rate is irregular  Interventions [] Provider notified [] Education provided [] No interventions required   Respiratory Assessment [x] WDL Patient:  [] Complains of SOB / DOE upon exertion [] Complains of SOB at rest [] Coarse [] Diminished [] Rhonchi [] Crackles/Rales [] Wheezes  Interventions [] Provider notified [] Education provided [] No interventions required   GI Assessment [] WDL Patient complains of: [] Constipation [] Diarrhea [x] Nausea [] Vomiting  Interventions: Pt took home dose of ondansetron . [] Provider notified [] Education provided [] No interventions required   GU Assessment [x] WDL Patient complains of: [] Urinary urgency [] Urinary frequency [] Bleeding [] Burning  Interventions: [] Provider notified [] Education provided [] No interventions required    Skin Assessment [x] WDL Patient: [] Has rash noted  [] Has bruising noted  [] Has signs of infection noted   Interventions: [] Provider  notified [] Education provided [] No interventions required   Nutritional and Fluid Assessment [x] WDL Patient: [] Fluid intake is decreased [] Appetite is decreased   Psychosocial Assessment [x] WDL Patient's behavior and mood is: [] Anxious [] Verbally aggressive [] Physically aggressive [] Cooperative [] Uncooperative  [] Tearful [] Calm    Isolation Precautions Patient with documented: [x] N/A [] Covid-19 [] c-Diff [] Shingles [] MRSA [] Other:    Patient: [] Requires Contact Isolation [] Requires Respiratory Isolation [] Requires Special Respiratory Isolation   Issues/Concerns Any issues/concerns since last treatment? [] Yes [x] No  Interventions [] Provider notified [] Education provided [x] No interventions required   Patient Response to Treatment Patient: [x] Tolerated well [] Utilizing effective coping [] Complains of    Discharge Status Patient: [x] Discharged home [] Admitted to Hospital [] Sent back to clinic  Patient left clinic: [x] Self/Ambulatory [] With Family [] Required Assistance [] Wheelchair

## 2024-06-02 NOTE — Progress Notes (Signed)
 Tums order placed.  ALICIA E BLUE, NP

## 2024-06-05 NOTE — Progress Notes (Signed)
 D: Pt is a 28 y.o., male with T-Cell LL who presents for Blood/Platelet Transfusion. Pt walked into treatment room in stable condition. Pt reports vomiting for 3 days and noted blood streak vomitus. Also mentions feeling chilly a while ago latest temp 99.2-. Pt vomited 2x while in treatment room-A. Blue,NP made aware.  A: POC maint. Labs drawn in phlebotomy with porta-cath accessed. Labs reviewed by RN. IV compazine given per order. RONAL Carbon, NP to bedside. 1 unit of platelets and 1 unit of PRBC were administered per Protocol, premedications were not administered. These were well tolerated with no evidence of transfusion reaction. Platelet count was not drawn. At completion of treatment, porta-cath flushed and hep-locked prior to de-access. Reminded pt to pick up his medications. Pt/caregiver fully understood. All orders completed.  R: Pt tol tx well. Pt/caregiver aware of reportable cond and contact info for primary oncologist and On Call Heme/Onc MD. Pt/caregiver verbally acknowledged understanding of instructions with no questions or concerns at time of d/c. Pt walked out of treatment room in stable condition.

## 2024-06-08 ENCOUNTER — Encounter (HOSPITAL_COMMUNITY): Payer: Self-pay

## 2024-06-08 ENCOUNTER — Emergency Department (HOSPITAL_COMMUNITY)

## 2024-06-08 ENCOUNTER — Emergency Department (HOSPITAL_COMMUNITY)
Admission: EM | Admit: 2024-06-08 | Discharge: 2024-06-08 | Disposition: A | Discharge status: Other Acute Inpatient Hospital

## 2024-06-08 DIAGNOSIS — R29703 NIHSS score 3: Secondary | ICD-10-CM

## 2024-06-08 DIAGNOSIS — C9590 Leukemia, unspecified not having achieved remission: Secondary | ICD-10-CM | POA: Diagnosis not present

## 2024-06-08 DIAGNOSIS — G939 Disorder of brain, unspecified: Secondary | ICD-10-CM

## 2024-06-08 DIAGNOSIS — R200 Anesthesia of skin: Secondary | ICD-10-CM | POA: Diagnosis present

## 2024-06-08 DIAGNOSIS — Z79899 Other long term (current) drug therapy: Secondary | ICD-10-CM

## 2024-06-08 DIAGNOSIS — A812 Progressive multifocal leukoencephalopathy: Secondary | ICD-10-CM

## 2024-06-08 DIAGNOSIS — I639 Cerebral infarction, unspecified: Secondary | ICD-10-CM | POA: Insufficient documentation

## 2024-06-08 LAB — I-STAT CHEM 8, ED
BUN: 14 mg/dL (ref 6–20)
Calcium, Ion: 1.11 mmol/L — ABNORMAL LOW (ref 1.15–1.40)
Chloride: 103 mmol/L (ref 98–111)
Creatinine, Ser: 1.2 mg/dL (ref 0.61–1.24)
Glucose, Bld: 90 mg/dL (ref 70–99)
HCT: 28 % — ABNORMAL LOW (ref 39.0–52.0)
Hemoglobin: 9.5 g/dL — ABNORMAL LOW (ref 13.0–17.0)
Potassium: 3.9 mmol/L (ref 3.5–5.1)
Sodium: 137 mmol/L (ref 135–145)
TCO2: 22 mmol/L (ref 22–32)

## 2024-06-08 LAB — PROTIME-INR
INR: 1.1 (ref 0.8–1.2)
Prothrombin Time: 14.7 s (ref 11.4–15.2)

## 2024-06-08 LAB — CBG MONITORING, ED: Glucose-Capillary: 95 mg/dL (ref 70–99)

## 2024-06-08 LAB — COMPREHENSIVE METABOLIC PANEL WITH GFR
ALT: 35 U/L (ref 0–44)
AST: 26 U/L (ref 15–41)
Albumin: 3.5 g/dL (ref 3.5–5.0)
Alkaline Phosphatase: 107 U/L (ref 38–126)
Anion gap: 10 (ref 5–15)
BUN: 12 mg/dL (ref 6–20)
CO2: 21 mmol/L — ABNORMAL LOW (ref 22–32)
Calcium: 9.1 mg/dL (ref 8.9–10.3)
Chloride: 104 mmol/L (ref 98–111)
Creatinine, Ser: 1.19 mg/dL (ref 0.61–1.24)
GFR, Estimated: >60 mL/min (ref >60)
Glucose, Bld: 94 mg/dL (ref 70–99)
Potassium: 3.9 mmol/L (ref 3.5–5.1)
Sodium: 135 mmol/L (ref 135–145)
Total Bilirubin: 1.8 mg/dL — ABNORMAL HIGH (ref 0.0–1.2)
Total Protein: 6.1 g/dL — ABNORMAL LOW (ref 6.5–8.1)

## 2024-06-08 LAB — CBC
HCT: 28.5 % — ABNORMAL LOW (ref 39.0–52.0)
Hemoglobin: 9.8 g/dL — ABNORMAL LOW (ref 13.0–17.0)
MCH: 29 pg (ref 26.0–34.0)
MCHC: 34.4 g/dL (ref 30.0–36.0)
MCV: 84.3 fL (ref 80.0–100.0)
Platelets: 60 K/uL — ABNORMAL LOW (ref 150–400)
RBC: 3.38 MIL/uL — ABNORMAL LOW (ref 4.22–5.81)
RDW: 13.3 % (ref 11.5–15.5)
WBC: 0.8 K/uL — CL (ref 4.0–10.5)
nRBC: 4.9 % — ABNORMAL HIGH (ref 0.0–0.2)

## 2024-06-08 LAB — ETHANOL: Alcohol, Ethyl (B): <15 mg/dL (ref <15)

## 2024-06-08 LAB — APTT: aPTT: 38 s — ABNORMAL HIGH (ref 24–36)

## 2024-06-08 MED ORDER — ASPIRIN 81 MG PO TBEC
81.0000 mg | DELAYED_RELEASE_TABLET | Freq: Every day | ORAL | Status: DC
  Administered 2024-06-08: 81 mg via ORAL
  Filled 2024-06-08: qty 1

## 2024-06-08 MED ORDER — SODIUM CHLORIDE 0.9% FLUSH
3.0000 mL | Freq: Once | INTRAVENOUS | Status: AC
  Administered 2024-06-08: 3 mL via INTRAVENOUS

## 2024-06-08 MED ORDER — ONDANSETRON HCL 4 MG/2ML IJ SOLN
4.0000 mg | Freq: Once | INTRAMUSCULAR | Status: AC
  Administered 2024-06-08: 4 mg via INTRAVENOUS
  Filled 2024-06-08: qty 2

## 2024-06-08 MED ORDER — IOHEXOL 350 MG/ML SOLN
100.0000 mL | Freq: Once | INTRAVENOUS | Status: AC | PRN
  Administered 2024-06-08: 100 mL via INTRAVENOUS

## 2024-06-08 NOTE — ED Notes (Signed)
 Patient transported to MRI

## 2024-06-08 NOTE — Consult Note (Signed)
 NEUROLOGY CONSULT NOTE   Date of service: June 08, 2024 Patient Name: Douglas Kline MRN:  [id number] DOB:  Sep 30, 1996 Chief Complaint: Left hand numbness and weakness with left sided facial droop Requesting Provider: Ula Prentice SAUNDERS, MD  History of Present Illness  Douglas Kline is a 28 y.o. male with a PMHx of leukemia diagnosed 7 months ago, on chemotherapy, being managed by the Oncology team at [facility] with planned bone marrow transplant, who presents to the ED from home as a Code Stroke via EMS with acute onset of  left hand numbness/tingling, incoordination and weakness with left sided facial droop. LKN was 2230 last night when he went to bed. He woke up this morning with numbness in his left hand, having trouble picking things up. EMS noted a left sided facial droop.   He denies any headache, vision loss, confusion, or speech deficit.   LKW: 2230 Modified rankin score: 0 IV Thrombolysis:  No: Out of the time window EVT:  No: Symptoms too mild to treat and no LVO on CTA  NIHSS components Score: Comment  1a Level of Conscious 0[x]  1[]  2[]  3[]      1b LOC Questions 0[x]  1[]  2[]       1c LOC Commands 0[x]  1[]  2[]       2 Best Gaze 0[x]  1[]  2[]       3 Visual 0[x]  1[]  2[]  3[]      4 Facial Palsy 0[]  1[x]  2[]  3[]     Left NL fold  5a Motor Arm - left 0[x]  1[]  2[]  3[]  4[]  UN[]    5b Motor Arm - Right 0[x]  1[]  2[]  3[]  4[]  UN[]    6a Motor Leg - Left 0[x]  1[]  2[]  3[]  4[]  UN[]    6b Motor Leg - Right 0[x]  1[]  2[]  3[]  4[]  UN[]    7 Limb Ataxia 0[]  1[x]  2[]  UN[]     Mild dysmetria LUE  8 Sensory 0[]  1[x]  2[]  UN[]     Mild numbness to FT, left V1-3 and left forearm below elbow and hand circumferentially  9 Best Language 0[x]  1[]  2[]  3[]      10 Dysarthria 0[x]  1[]  2[]  UN[]      11 Extinct. and Inattention 0[x]  1[]  2[]       TOTAL:  3      ROS  Comprehensive ROS performed and pertinent positives documented in HPI    Past History  Leukemia on chemotherapy  No past surgical history on  file.  Family History: Family History  Problem Relation Age of Onset   Lupus Mother    Hypertension Father     Social History  reports that he has been smoking cigars. He has never used smokeless tobacco. He reports current alcohol use. He reports current drug use. Drug: Marijuana.  Allergies  Allergen Reactions   Morphine And Codeine Hives    Medications   Current Facility-Administered Medications:    sodium chloride  flush (NS) 0.9 % injection 3 mL, 3 mL, Intravenous, Once, Ula Prentice SAUNDERS, MD  Current Outpatient Medications:    azithromycin  (ZITHROMAX ) 250 MG tablet, Take 1 tablet (250 mg total) by mouth daily. Take first 2 tablets together, then 1 every day until finished. (Patient not taking: Reported on 09/27/2023), Disp: 6 tablet, Rfl: 0   valACYclovir  (VALTREX ) 500 MG tablet, Take 1 tablet (500 mg total) by mouth 2 (two) times daily., Disp: 6 tablet, Rfl: 3  Vitals   BP 125/77, HR 118, RR 15, O2 saturation 100%   Physical Exam   Constitutional: Appears well-developed and well-nourished.  Psych: Affect appropriate to situation.  Eyes: No scleral injection.  HENT: No OP obstruction.  Head: Normocephalic.  Respiratory: Effort normal, non-labored breathing.  Skin: Slight edema to arms and face. Alopecia noted.   Neurologic Examination   See NIHSS  Labs/Imaging/Neurodiagnostic studies   CBC:  Recent Labs  Lab June 24, 2024 0908  HGB 9.5*  HCT 28.0*   Basic Metabolic Panel:  Lab Results  Component Value Date   NA 137 06-24-24   K 3.9 2024/06/24   CO2 30 09/26/2023   GLUCOSE 90 06/24/24   BUN 14 2024/06/24   CREATININE 1.20 06/24/2024   CALCIUM 9.5 09/26/2023   GFRNONAA >60 09/26/2023   GFRAA  10/07/2010    NOT CALCULATED        The eGFR has been calculated using the MDRD equation. This calculation has not been validated in all clinical situations. eGFR's persistently <60 mL/min signify possible Chronic Kidney Disease.    ASSESSMENT  28 y.o.  male with a PMHx of leukemia diagnosed 7 months ago, on chemotherapy, being managed by the Oncology team at [facility] with planned bone marrow transplant, who presents to the ED from home as a Code Stroke via EMS with acute onset of  left hand numbness/tingling, incoordination and weakness with left sided facial droop. LKN was 2230 last night when he went to bed. He woke up this morning with numbness in his left hand, having trouble picking things up. EMS noted a left sided facial droop. He denies any headache, vision loss, confusion, or speech deficit.  - Exam reveals mild left facial weakness, mild LUE dysmetria, and mild sensory loss involving the face and left arm. NIHSS 3.  - STAT CT head: No acute intracranial abnormality. Aspects 10/10 - STAT CTA of head and neck with CTP: No acute large vessel occlusion. No hemodynamically significant stenosis or aneurysm in the head or neck vessels. No evidence of ischemia by CT brain perfusion. - MRI brain w/o contrast: Focal area of restricted diffusion in the right parietal white matter and posterior corona radiata, measuring 4.0 x 1.5 x 1.6 cm, without significant mass effect or definite cortical involvement, with associated subtle T2 and FLAIR changes. No other significant white matter disease. Findings are most consistent with an acute infarct, given the acute onset of symptoms. 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 Also on the DDx is PML or initial stages of a tumefactive demyelinating lesion.  - Labs: - Chemistries are essentially unremarkable except for a low ionized Ca of 1.11, low total protein of 6.1 and elevated total bilirubin of 1.8. AST and ALT are normal. Alkaline phosphatase normal. Renal function normal. Glucose normal.  - CBC reveals low WBC of 0.8, anemia with Hgb 9.5 and low platelets of 60.  - PT and INR are normal. APTT elevated at 38.  - EtOH negative.  - EKG: Sinus tachycardia; Probable left atrial enlargement - Impression: Acute  onset of left hand numbness/tingling, incoordination and weakness with left sided facial droop in a patient on chemotherapy for leukemia. Correlating DWI lesion seen on non-contrast MRI has a broad DDx including, atypical-appearing acute ischemic white matter infarction, CNS lymphoma, PML or initial stages of a tumefactive demyelinating lesion.   RECOMMENDATIONS  - Follow up MRI brain with contrast to assess for possible enhancement of the lesion seen on noncontrast MRI.  - ASA 81 mg po x 1.  - He is being transferred to [facility] for further management by his Oncology team there.   ______________________________________________________________________  SignedMERRIANNE Zaneta Lightcap, MD [facility]

## 2024-06-08 NOTE — H&P (Signed)
 Hospital Medicine Admission History & Physical  Time of Service: 06/09/2024, 2:18 AM  PCP: [name] Aurora Eans, MD, Phone [phone], Fax [phone]   Chief Complaint  Stroke-like symptoms  History of Present Illness  Douglas Kline is a 28 y.o. 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 Melia), G6PD deficiency who presents following transfer from [facility] to [facility] for COC.  Last known normal at 2230 on 06/07/24. Woke up with L sided facial and arm numbness/tingling, L arm weakness, L leg weakness with associated difficulty balancing. Family noted L sided facial droop. Was stroke coded upon arrival to [facility], with administration of ASA 81 mg and the following imaging findings:  CT head: NAICP.  CTA: IMPRESSION: 1. No acute large vessel occlusion. 2. No hemodynamically significant stenosis or aneurysm in the head or neck vessels. 3. No evidence of ischemia by CT brain perfusion.   MRI brain without contrast: IMPRESSION: 1. Focal area of restricted diffusion in the right parietal white matter and posterior corona radiata, measuring 4.0 x 1.5 x 1.6 cm, without significant mass effect or definite cortical involvement, with associated subtle T2 and FLAIR changes. No other significant white matter disease. 2. Findings are most consistent with an acute infarct, given the acute onset of symptoms. 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 MRI of the brain may be useful for further evaluation.   Patient was subsequently transferred to [facility] for continuity of care.   Upon our interview, patient confirms the above history and states that he is feeling generally much better, with resolution of facial droop, L arm weakness, and L leg weakness; walking around room without issue. Denies sick contacts, HA, blurry vision, diplopia, difficulty swallowing, CP, SOB, abdominal pain, dysuria, hematuria, constipations, LOC, falls. Does  endorse new palpitations (HR 122 in room; notes HR does not usually run fast. HR 106 yesterday on home check) as well as what feels like gas running from stomach to throat; queries if this is heartburn. Has been taking Pepcid intermittently for this.  ONCOLOGIC HISTORY:  - 08/16/23 - evaluated in urgent care given concern for STD exposure and noted to have cervical LN swelling x 1 week. LDH 215. STD testing was negative. - 09/12/23 - re-presented to urgent care for persistent cervical LAD, had negative mono test, and prescribed z-pak - 09/25/23 - presented to ED for neck swelling, prompting oncology referral, reportedly had normal CBC - 09/26/23 - established care with Dr. 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 Flow cytometry showed minor CD34+ blastic population.  - 10/24/23 - established care at [facility] referred by Dr. Federico - 10/24/23 - BMBX with 30% blasts (T-ALL/LBL), FISH with monosomy 9, NGS with RUNX1 mutation, heme fusion panel negative - 10/26/23 - MRI brain with scattered enlarged LNs in R retropharyngeal space, bilateral parotid glands, and cervical stations 2 and 5 - 10/26/23 - PETCT with hypermetabolic LAD above and below diaphragm - 10/30/23 - CALGB 89596, Course I - 11/26/23 - BMBX with 0.8% residual phenotypically abnormal T-lymphoblasts, +ClonoSeq ([id number] residual cells per million) - 12/05/23 -  PETCT with marked improvement in hypermetabolic adenopathy throughout, clustered new LUL pulmonary nodules favored to be  infectious/inflammatory - 12/11/23 - CALGB 89596, Course II - 01/29/24 - BMBX initially read as 12% blasts but later revised as felt these blasts were from a reconstituting marrow, and was corrected to no detectable abnormal blasts with +ClonoSeq (1,074 residual cells per million) - 02/12/24 - CALGB 89596, Course III (briefly interrupted due to concern for relapse but hemepath read corrected as above) - 03/31/24 - PETCT negative - 04/01/24 - BMBX negative, +ClonoSeq (420 residual cells per million) - 04/09/24 - CALGB 89596, Course IV  Medical History  Past Medical History Past Medical History:  Diagnosis Date  . Chlamydia   . G6PD deficiency   . Herpes     Past Surgical History Past Surgical History:  Procedure Laterality Date  . TONSILLECTOMY & ADENOIDECTOMY      Family History Family History  Problem Relation Name Age of Onset  . No Known Problems Mother    . No Known Problems Father    . Pancreatic cancer Maternal Grandmother    . Breast cancer Paternal Grandmother    . Colon cancer Paternal Grandfather      Social History Social History   Socioeconomic History  . Marital status: Single  Tobacco Use  . Smoking status: Never  . Smokeless tobacco: Never  Vaping Use  . Vaping status: Former  Substance and Sexual Activity  . Alcohol use: Not Currently  . Drug use: Yes    Types: Other-see comments, Marijuana    Comment: Daily Marijuana smoker  . Sexual activity: Yes   Social Drivers of Corporate investment banker Strain: High Risk (06/08/2024)   Overall Financial Resource Strain (CARDIA)   . Difficulty of Paying Living Expenses: Hard  Food Insecurity: No Food Insecurity (06/08/2024)   Hunger Vital Sign   . Worried About Programme researcher, broadcasting/film/video in the Last Year: Never true   . Ran Out of Food in the Last Year: Never true  Transportation Needs: Unknown (06/08/2024)   PRAPARE - Transportation   . Lack of Transportation (Medical): No    Allergies & Medications    Allergies  Allergen Reactions  . Aspirin  Other (See Comments)    G6PD deficiency  . Bactrim [Sulfamethoxazole-Trimethoprim] Other (See Comments)    G6PD deficiency  . Dapsone Other (See Comments)    G6PD deficiency  . Levaquin [Levofloxacin] Other (See Comments)    G6PD deficiency  . Morphine Hives  . Nitrofurantoin Other (See Comments)    G6PD deficiency  . Quinidine Other (See Comments)    G6PD deficiency  . Rasburicase Other (See Comments)    G6PD deficiency    Medications Prior to Admission Medications  Prescriptions Last Dose Taking?  OLANZapine (ZYPREXA) 10 MG tablet  No  Sig: Take 1 tablet (10 mg total) by mouth at bedtime for 30 days  acyclovir (ZOVIRAX) 400 MG tablet  No  Sig: Take 1 tablet (400 mg total) by mouth 2 (two) times daily  famotidine (PEPCID) 10 MG tablet  No  Sig: Take 1 tablet (10 mg total) by mouth 2 (two) times daily as needed for Heartburn  metoclopramide (REGLAN) 5 MG tablet  No  Sig: Take 2 tablets (10 mg total) by mouth 3 (three) times daily as needed  ondansetron  (ZOFRAN ) 8 MG tablet  No  Sig: Take 1 tablet (8 mg total) by mouth every 12 (twelve) hours as needed for nausea or vomiting  pentamidine (NEBUPENT) 300  mg solution for nebulization  No  Sig: Take 6 mLs (300 mg total) by nebulization every 28 (twenty-eight) days Inhalation in clinic due to G6PD deficiency - next due ~1/16  polyethylene glycol (MIRALAX) packet  No  Sig: Take 1 packet (17 g total) by mouth 2 (two) times daily Purchase over the counter Mix in 4-8ounces of fluid prior to taking.  scopolamine (TRANSDERM-SCOP) 1 mg over 3 days patch  No  Sig: Place 1 patch (1 mg total) onto the skin every third day  sennosides-docusate (SENOKOT-S) 8.6-50 mg tablet  No  Sig: Take 2 tablets by mouth 2 (two) times daily Purchase over the counter    Facility-Administered Medications: None     Review of Systems  A complete review of systems was performed and is negative except as reviewed  in the HPI.  Physical Exam    Current Vital Signs 24h Vital Sign Ranges  T 36.8 C (98.2 F) (06/08/24 2356) Temp  Avg: 37.3 C (99.1 F)  Min: 36.8 C (98.2 F)  Max: 37.6 C (99.6 F)  BP 133/70 (06/08/24 2356) BP  Min: 131/77  Max: 135/84  HR 98 (06/08/24 2356) Pulse  Avg: 105.3  Min: 96  Max: 122  RR 18 (06/08/24 2356) Resp  Avg: 18.7  Min: 18  Max: 20  O2sat 100 %   SpO2  Avg: 100 %  Min: 100 %  Max: 100 %  Weight 76.5 kg (168 lb 10.4 oz) (06/08/24 1855)   Body mass index is 26.44 kg/m. General: alert, cooperative, in NAD Eyes: PERRL, EOMI, conjunctiva clear, anicteric sclera HENT: oropharynx clear, moist mucous membranes Neck: no adenopathy, no JVD, supple, symmetrical, trachea midline, and no thyromegaly CV: tachycardic, without murmurs, rubs or gallops Resp: clear to auscultation, good air exchange Abd: soft, nontender, nondistended, normoactive bowel sounds  Rectal: deferred Ext: no lower extremity edema Skin: no rashes or lesions Psych: oriented to time, place and person, mood and affect are appropriate Neuro: CN II-XII intact. Grossly normal and symmetric strength in upper and lower extremities. Intact sensation to light touch throughout. Normal coordination.  Data   Recent Results (from the past 24 hours)  Troponin I, High Sensitivity Inpatient Evaluation (hsTnI)   Collection Time: 06/08/24 10:33 PM  Result Value Ref Range   hsTnI Baseline 7 ng/L   hsTnI Interpretation Indeterminate Rule-Out, Indeterminate  Thyroid Stimulating Hormone (TSH)   Collection Time: 06/08/24 10:33 PM  Result Value Ref Range   Thyroid Stimulating Hormone (TSH) 0.65 0.34 - 5.66 IU/mL  Lipid Panel W/Calculated Low Density Lipoprotein (LDL) Cholesterol   Collection Time: 06/08/24 10:33 PM  Result Value Ref Range   Cholesterol, Total 71 <200 mg/dL   LDL Calculated 35 <899 mg/dL   HDL 24 mg/dL   Triglyceride 58 <849 mg/dL  C-Reactive Protein (CRP), Inflammatory   Collection Time:  06/08/24 10:33 PM  Result Value Ref Range   CRP (C-reactive Protein, Inflammatory) 1.38 (H) <=0.85 mg/dL  Sedimentation Rate-Automated   Collection Time: 06/08/24 10:33 PM  Result Value Ref Range   Sedimentation Rate-Automated <1 <15 mm/hr  Complete Blood Count (CBC)   Collection Time: 06/08/24 10:33 PM  Result Value Ref Range   WBC (White Blood Cell Count) 0.8 (LL) 3.2 - 9.8 x10^9/L   Hemoglobin 8.9 (L) 13.7 - 17.3 g/dL   Hematocrit 74.6 (L) 60.9 - 49.0 %   Platelets 66 (L) 150 - 450 x10^9/L   MCV (Mean Corpuscular Volume) 82 80 - 98 fL   MCH (Mean  Corpuscular Hemoglobin) 29.0 26.5 - 34.0 pg   MCHC (Mean Corpuscular Hemoglobin Concentration) 35.2 31.5 - 36.3 %   RBC (Red Blood Cell Count) 3.07 (L) 4.37 - 5.74 x10^12/L   RDW-CV (Red Cell Distribution Width) 13.1 11.5 - 14.5 %   NRBC (Nucleated Red Blood Cell Count) 0.00 0 x10^9/L   NRBC % (Nucleated Red Blood Cell %) 0.0 %   MPV (Mean Platelet Volume)     Immature Platelet Fraction 3.5 1.6 - 8.5 %  Comprehensive Metabolic Panel (CMP)   Collection Time: 06/08/24 10:33 PM  Result Value Ref Range   Sodium 134 (L) 135 - 145 mmol/L   Potassium 4.0 3.5 - 5.0 mmol/L   Chloride 101 98 - 108 mmol/L   Carbon Dioxide (CO2) 23 21 - 30 mmol/L   Urea Nitrogen (BUN) 14 7 - 20 mg/dL   Creatinine 1.1 0.6 - 1.3 mg/dL   Glucose 91 70 - 859 mg/dL   Calcium 8.8 8.7 - 89.7 mg/dL   AST (Aspartate Aminotransferase) 19 15 - 41 U/L   ALT (Alanine Aminotransferase) 32 15 - 50 U/L   Bilirubin, Total 1.1 0.4 - 1.5 mg/dL   Alk Phos (Alkaline Phosphatase) 106 24 - 110 U/L   Albumin 3.2 (L) 3.5 - 4.8 g/dL   Protein, Total 5.8 (L) 6.2 - 8.1 g/dL   Anion Gap 10 3 - 12 mmol/L   BUN/CREA Ratio 13 6 - 27   Glomerular Filtration Rate (eGFR)  94 mL/min/1.73sq m  Coagulation Screen   Collection Time: 06/08/24 10:33 PM  Result Value Ref Range   Prothrombin Time 14.2 (H) 9.5 - 13.1 sec   Prothrombin INR 1.2 (H) 0.9 - 1.1   Act Partial Thromboplastin Time 37.3  (H) 26.8 - 37.1 sec   Fibrinogen 186 (L) 213 - 435 mg/dL  Bilirubin, Direct   Collection Time: 06/08/24 10:33 PM  Result Value Ref Range   Bilirubin, Conjugated 0.2 0.1 - 0.6 mg/dL  ECG 87-ozji   Collection Time: 06/08/24 11:14 PM  Result Value Ref Range   Vent Rate (bpm) 94    PR Interval (msec) 130    QRS Interval (msec) 80    QT Interval (msec) 340    QTc (msec) 425   Type And Screen   Collection Time: 06/08/24 11:32 PM  Result Value Ref Range   ABO RH TYPE O Positive    Antibody Screen Negative    Specimen Outdate 06-11-2024 23:59    Performing Lab [facility]     EKG: normal EKG, normal sinus rhythm,    Radiology Studies on Admission: No results found.  Assessment & Plan  TITO AUSMUS is a 28 y.o. male admitted for the following problems: Principal Problem:   Left-sided weakness   # L sided weakness Patient initially presented with findings of L facial droop and LUE/LLE weakness, numbness, which have subsequently near-resolved as of time of admission to [facility]. MRI findings do localize to noted symptoms, included focal area of restricted diffusion in the right parietal white matter and posterior corona radiata; unclear at this time, however, if findings reflect acute infarct (particularly given acute onset of symptoms) versus CNS lymphoma, though lack of contrast renders differentiation difficult. Differential also includes TIA given resolution of sx, though CTA w/o significant stenosis/occlusion and with localizing findings; seizure, given resolution (though less likely given lack of LOC, post-ictal state). Suspect malignancy and current therapy are primary drivers of thrombotic risk underlying likely stroke. Out of window for  TNK at time of admission to OSH; given ASA, though platelets of 7 on admission likely render [facility] a risky treatment option (of note, plt 66 on repeat labs overnight). Will initiate intiial stroke evaluation overnight, with plans to c/s Neuro in  AM for further recommendations. [ ]  c/s Neuro Dx: - EKG on admission - telemetry - echo with bubble - NPO; SLP eval in AM - MRI brain w/wo contrast - Requested [employer] + over-read of OSH images  Tx: - s/p ASA 81 at OSH  # T-cell ALL/LBL Follows with Dr. Melia in outpatient setting. From clinic note dated 06/02/24: T-cell ALL/LBL currently on CALGB 89596. He has now completed induction, which he tolerated well. His PET scan showed a complete remission after induction but he continues to have MRD.  This is a challenging situation as he has had a relatively favorable response to 10403. However, there is persistent MRD by ClonoSeq which raises the question of how durable his response will be, especially if we move towards maintenance. Next line therapy for T-ALL is limited and would typically involve a nelarabine-based regimen followed by allogeneic SCT. One option, given his young age, would be to proceed with allogeneic SCT soon in order to consolidate his response and give him the highest chance for a cure, but this does come with corresponding risk related to an allogeneic SCT. He is currently receiving course IV so we will recheck his ClonoSeq after the completion of chemotherapy.   RECOMMENDATIONS/PLAN: - Continue course IV of CALGB 10403              - PPX: acyclovir, pentamidine (last dose 05/28/24) - schedule next on 06/25/24 - He has a G6PD deficiency - Normal TPMT activity - He is undergoing allogeneic SCT evaluation with Dr. Wilfred - Follows with palliative care for nausea [ ]  repeat BMBX with ClonoSeq on 06/09/24 with Alicia Blue  # GERD Ordered Zofran  IV x1 for nausea while NPO. Continue PTA Pepcid.  # CINV Taking scopolamine, Reglan, Zofran  OP as needed. Recently started on Zyprexa OP (has taken one dose); family notes interest in discontinuing and encourage to d/w OP team.  # DVT ppx SCDs iso thrombocytopenia.  Comorbid Conditions: Nutritional Disorders:     Hypoalbuminemia:  Hypoalbuminemia present with lowest albumin of 3.2.   Hypoalbuminemia is associated with increased risk for patients.  We will attempt to treat the underlying condition(s) contributing to this low albumin state. High Risk Diagnoses:    Metastatic Cancer:  Patient has the following condition(s) on active problem list: T-cell acute lymphoblastic leukemia (ALL) (CMS/HHS-HCC) (10/25/2023)  If any concerns arise related to his metastatic cancer, will coordinate with his oncologist or oncology consult service as appropriate.  Electrolyte Disorders:    Hyponatremia:  Hyponatremia present with lowest sodium of 134.  Will continue to monitor.   Hematologic Disorders:    Anemia:  Anemia present with lowest hemoglobin of 8.9.  Will continue to monitor.     Thrombocytopenia:  Thrombocytopenia present with lowest platelet count of 66.  Will continue to monitor and assess for bleeding complications.    Code Status: Full Code  Patient Class & Status: Inpatient, Intermediate  Discharge Planning: pending course    VALE COUNTESS, MD  [facility] 06/09/2024 2:18 AM  Attestation Statement:  Formally staffed and evaluated on 06/09/2024 MRI brain w contrast pursued inpatient: Areas of patchy restricted diffusion in the right posterior centum semiovale without associated enhancement. In the setting of immunocompromised state, differential considerations include  PML, leukemic infiltration, and treatment related toxicity. Acute infarct/watershed ischemia is considered unlikely. Discussed the case with neurology team and the patient's primary oncologist. Plan for LP and bone marrow biopsy tomorrow. CSF studies to further clarify cause of patient's presentation and MRI findings. Hold vincristine per oncologist recs. Note was written by Srinivasan, Roshini, MD. I personally saw and evaluated the patient, and participated in the management and treatment plan as documented in the  resident/fellow note. Curtistine Mackie MD, PhD

## 2024-06-08 NOTE — ED Triage Notes (Addendum)
 Pt BIB [facility] from home. Pt went to bed at 2230 last night feeling normal and woke up this morning felt numbness in his left hand, had trouble picking things up. EMS noted left sided facial droop

## 2024-06-08 NOTE — Code Documentation (Signed)
 Stroke Response Nurse Documentation Code Documentation  Douglas Kline is a 28 y.o. male arriving to [facility]  via [facility] on 06-08-24 with past medical hx of leukemia diagnosed 7 months ago. On No antithrombotic. Code stroke was activated by EMS.   Patient from home where he was LKW at 2230 last night and now complaining of Left side numbness/tingling and incoordination.  EMS also noted left facial droop.  Stroke team at the bedside on patient arrival. Labs drawn and patient cleared for CT by Dr. Ula. Patient to CT with team. NIHSS 3, see documentation for details and code stroke times. Patient with left facial droop, left limb ataxia, and left decreased sensation on exam. The following imaging was completed:  CT Head, CTA, and CTP. Patient is not a candidate for IV Thrombolytic due to outside TNK window. Patient is not a candidate for IR due to no LVO on CTA.   Care Plan: VS and NIHSS q 2 hours x 12 hours.   Bedside handoff with ED RN [facility].    Elvin Portland  Stroke Response RN

## 2024-06-08 NOTE — ED Notes (Signed)
 Report called to [employer]. Transport set up with [facility] ETA 1 hour.

## 2024-06-08 NOTE — Consults (Signed)
 RRT Consult start time: 1924 Consult Trigger: Outside Hospital Admission Rounding Reason for Consult: Nursing Assessment/Reassessment O2 Saturation at start of the RRT Consult: >95% O2 Saturation at end of the RRT Consult: >95% Interventions: Advice/consult and Plan of care reviewed   Pt recently admitted from OSH. VSS. NAD noted. No PRT needs identified at this time. Primary RN encouraged to reach out to PRT with any needs or concerns.   Visit Vitals BP 131/77 (BP Location: Right upper arm, Patient Position: Lying)  Pulse (!) 122  Temp 37.6 C (99.6 F) (Oral)  Resp 18      Patient outcome: Stayed in room Patient unit location at the end of the RRT Consult: DCT 11 A/B Consult End time: 84

## 2024-06-08 NOTE — ED Notes (Signed)
 [facility] will call and give bed assignment in the next hour

## 2024-06-08 NOTE — ED Provider Notes (Signed)
 [place] EMERGENCY DEPARTMENT AT [facility] Provider Note   CSN: [id number] Arrival date & time: 06/08/24  9140  An emergency department physician performed an initial assessment on this suspected stroke patient at 45.  Patient presents with: Code Stroke   Douglas Kline is a 28 y.o. male.  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  This has been going on since patient woke up this morning.  The patient went to bed at 10:30 PM last night.  Did not have any symptoms at that point.  He woke up this morning was having the above symptoms as well as some dysarthria.  This improved on the way to the emergency department per medics.        Prior to Admission medications   Medication Sig Start Date End Date Taking? Authorizing Provider  azithromycin  (ZITHROMAX ) 250 MG tablet Take 1 tablet (250 mg total) by mouth daily. Take first 2 tablets together, then 1 every day until finished. Patient not taking: Reported on 09/27/2023 09/12/23   Rolinda Rogue, MD  valACYclovir  (VALTREX ) 500 MG tablet Take 1 tablet (500 mg total) by mouth 2 (two) times daily. 08/16/23   Johnie Flaming A, NP    Allergies: Morphine and codeine    Review of Systems  Neurological:  Positive for weakness and numbness.  All other systems reviewed and are negative.   Updated Vital Signs BP (!) 156/100   Pulse (!) 111   Temp 98.8 F (37.1 C) (Oral)   Resp 15   Wt 77 kg   SpO2 100%   BMI 26.59 kg/m   Physical Exam Vitals and nursing note reviewed.   Gen: NAD Eyes: PERRL, EOMI HEENT: no oropharyngeal swelling Neck: trachea midline Resp: clear to auscultation bilaterally Card: RRR, no murmurs, rubs, or gallops Abd: nontender, nondistended Extremities: no calf tenderness, no edema Vascular: 2+  radial pulses bilaterally, 2+ DP pulses bilaterally Neuro: Patient reports diminished sensation in the left upper extremity and does have very mild past-pointing on finger-to-nose testing on the left, and a stroke scale of 2 currently.  Please see NIH stroke scale from stroke neurologist on initial arrival. Skin: no rashes Psyc: acting appropriately   (all labs ordered are listed, but only abnormal results are displayed) Labs Reviewed  APTT - Abnormal; Notable for the following components:      Result Value   aPTT 38 (*)    All other components within normal limits  CBC - Abnormal; Notable for the following components:   WBC 0.8 (*)    RBC 3.38 (*)    Hemoglobin 9.8 (*)    HCT 28.5 (*)    Platelets 60 (*)    nRBC 4.9 (*)    All other components within normal limits  COMPREHENSIVE METABOLIC PANEL WITH GFR - Abnormal; Notable for the following components:   CO2 21 (*)    Total Protein 6.1 (*)    Total Bilirubin 1.8 (*)    All other components within normal limits  I-STAT CHEM 8, ED - Abnormal; Notable for the following components:   Calcium, Ion 1.11 (*)    Hemoglobin 9.5 (*)    HCT 28.0 (*)    All other components within normal limits  PROTIME-INR  ETHANOL  DIFFERENTIAL  CBG MONITORING, ED    EKG: None  Radiology: CT HEAD CODE STROKE  WO CONTRAST Result Date: 06/08/2024 EXAM: CT HEAD WITHOUT 06/08/2024 09:11:58 AM TECHNIQUE: CT of the head was performed without the administration of intravenous contrast. Automated exposure control, iterative reconstruction, and/or weight based adjustment of the mA/kV was utilized to reduce the radiation dose to as low as reasonably achievable. COMPARISON: CT head without contrast 06/03/2013. CLINICAL HISTORY: Neuro deficit, acute, stroke suspected. FINDINGS: BRAIN AND VENTRICLES: No acute intracranial hemorrhage. No mass effect or midline shift. No extra-axial fluid collection. Gray-white differentiation is maintained. No hydrocephalus. ORBITS:  No acute abnormality. SINUSES AND MASTOIDS: No acute abnormality. SOFT TISSUES AND SKULL: No acute skull fracture. No acute soft tissue abnormality. [place] stroke program early CT (aspect) score ----- Ganglionic (caudate, ic, Lentiform Nucleus, insula, M1-m3): 7 Supraganglionic (m4-m6): 3 Total: 10 IMPRESSION: 1. No acute intracranial abnormality. 2. Aspects 10/10 The pertinent results were texted to Dr. Lindzen via the [facility] system at 9:18 am. Electronically signed by: Lonni Necessary MD 06/08/2024 09:19 AM EDT RP Workstation: [id number]    {Document cardiac monitor, telemetry assessment procedure when appropriate:32947} Procedures   Medications Ordered in the ED  sodium chloride  flush (NS) 0.9 % injection 3 mL (3 mLs Intravenous Given 06/08/24 0949)  iohexol  (OMNIPAQUE ) 350 MG/ML injection 100 mL (100 mLs Intravenous Contrast Given 06/08/24 0939)      {Click here for ABCD2, HEART and other calculators REFRESH Note before signing:1}                              Medical Decision Making 28 year old male with past medical history of a LL on chemotherapy presenting to the emergency department today with left upper extremity weakness, numbness, and discoordination this morning upon awakening.  The symptoms do seem to have improved.  The patient's been evaluated by our stroke neurology team.  Recommended MRI and recommend admission for further stroke evaluation.  Given the patient's care at [facility] and with his other medical issues admission to [facility] is recommended.  Call was placed to their transfer center.  His symptoms do seem to have improved so this does appear to be a TIA.  Will allow for permissive hypertension.  Will continue to monitor.  Amount and/or Complexity of Data Reviewed Labs: ordered. Radiology: ordered.   ***  {Document critical care time when appropriate  Document review of labs and clinical decision tools ie CHADS2VASC2, etc  Document your independent review of radiology  images and any outside records  Document your discussion with family members, caretakers and with consultants  Document social determinants of health affecting pt's care  Document your decision making why or why not admission, treatments were needed:32947:::1}   Final diagnoses:  None    ED Discharge Orders     None

## 2024-06-08 NOTE — ED Provider Notes (Signed)
 Pt has been accepted at [facility] by Dr. Fronie.  He remains stable for transport.   Dean Clarity, MD 06/08/24 [phone]

## 2024-06-09 LAB — DIFFERENTIAL
Abs Immature Granulocytes: 0 K/uL (ref 0.00–0.07)
Basophils Absolute: 0 K/uL (ref 0.0–0.1)
Basophils Relative: 0 %
Eosinophils Absolute: 0 K/uL (ref 0.0–0.5)
Eosinophils Relative: 0 %
Lymphocytes Relative: 92 %
Lymphs Abs: 0.7 K/uL (ref 0.7–4.0)
Monocytes Absolute: 0 K/uL — ABNORMAL LOW (ref 0.1–1.0)
Monocytes Relative: 0 %
Neutro Abs: 0.1 K/uL — CL (ref 1.7–7.7)
Neutrophils Relative %: 8 %
nRBC: 0 /100{WBCs}

## 2024-06-09 NOTE — Progress Notes (Addendum)
 Speech Language Cognitive Evaluation  Location: Inpatient / 11A03/11A03-01 Time of Service: [phone] Verification from Source Document: 1. Patient's name 2. DOB 3.Correct Procedure Falls Risk: No Reason for Consult: Stroke order set; passed dysphagia screen   per chart on 06/08/24 at 2300)  Medical History     Admission Date: 06/08/2024  Current Medical History: Douglas Kline is a 28 y.o. 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 The clinical presentation is suggestive of a vascular process, given the acute onset and underlying prothrombotic risk factors (malignancy and ongoing therapy). However, MRI of the brain with contrast revealed findings that are atypical for infarction, warranting further evaluation to determine the etiology of his neurological symptoms/neuroimaging findings. Drug-induced neurotoxicity (such as from methotrexate, which is part of the CALGB regimen) is a potential differential, with other differentials being infectious, inflammatory, or malignant.   Additional information per Neurology note on 06/09/24: He woke up in the morning of 7/27, circa 0800Hs, and noticed he was having difficulty walking towards his bathroom: he felt as if he was falling to the left side, and had to hold onto objects along his path (such as the side of the bed) to support himself. When he got out of the bathroom, his girlfriend noticed that the left side of his face was droopy. Noticing his unsteady gait, the girlfriend assisted him, on his walk back to the bed. He now noticed that he was having trouble raising his left upper extremity or grabbing things with his left hand. Additional symptoms at the time = left anterior forearm numbness - which he has experienced in the past and it tends to occur whenever he lies/sleeps on his left forearm. He reports  dysarthria - but denies difficulty with word finding; no issues with comprehension at the time of his symptoms. He was reviewed in a OSH [facility]) circa [phone] at which point his only symptom was residual weakness on the left side of his face (the left upper and lower extremity weakness had subjectively resolved). 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 Melia). At the OSH, he had an MRI (without contrast) done that was reported as possibly an infarct and a CTA that ruled large vessel disease. He received aspirin . At initial presentation at our facility here yesterday, his symptoms had completely resolved. He does not have any residual symptoms at the time of his review this morning.[id number] the ED, vital signs were as follows: T 37.6 C (99.6 F), HR (!) 122, BP 131/77, RR 18, and satting 100 % on R/A. BMP as follows: Na 136, K 4.1, HCO3 26, BUN 14, Cr 1.1, glucose 90. CBC as follows: WBC 0.8*, Hgb 9.1*, plts 71*. (Additional history as per the hematology team: adjustments recently made to his hematological treatment regimen, that may explain his new onset thrombocytopenias noted last week = required platelet transfusion last week).  At the time of this review (7/28, 1100Hs), he denies any weakness, numbness, changes in vision, changes in speech, difficulty swallowing, bowel or bladder changes, incoordination, recent illness, headache, dizziness, chest pain, shortness of breath, dysuria, and new medications.  Relevant Past Medical History:  Past Medical History:  Diagnosis Date  .  Chlamydia   . G6PD deficiency   . Herpes     Relevant Imaging:  CT neuro outside interpretation (06/09/24): No acute intracranial abnormality.  CT  neuro outside interpretation (06/09/24):  1.  No acute intracranial abnormality. 2.  No intracranial large vessel occlusion or hemodynamically significant arterial stenosis in the neck.  3.  No perfusion abnormality noted on included RAPID perfusion scans.  MRI brain (06/09/24): Areas of patchy restricted diffusion in the right posterior centum semiovale without associated enhancement. In the setting of immunocompromised state, differential considerations are broad and include progressive multifocal leukoencephalopathy (PML), leukemic infiltration, and treatment related toxicity. Acute infarct/watershed ischemia is considered unlikely.  Social and Communication History   History provided by: patient, medical records  Communication History: no concerns at baseline.  Current Communication difficulties: denies current difficulty today, however on admit, chart noted dysarthria which has since resolved. Date of onset: 06/08/2024 (date of hospital admission) Social History: lives alone and works full time at [employer]; per [employer], he has 1 son who is 80 years old (son's birthday is today). Baseline Responsibilities:  Independent with all ADLs/iADLs Education: some years of college   [employer]    Greeting:  Looked at clinician upon knocking and entry to room, Made small talk appropriately Patient verification: stated name, stated date of birth, verified via chart and armband Present in today's session: girlfriend remained in room and RN was intermittently present in room Patient/family/medical team priorities for this session: stroke order set ordered however Pt passed dysphagia screen per chart. Comments: Patient's preferred name: Douglas Kline  Evaluation Environment   Level of Alertness: Awake, alert, and participatory Pain Score: Numeric response: 0/10  Report pain score: patient reported pain score without cues  Hearing: No documented or reported audiology evaluation Vision: Per Pt,  he is nearsighted so he wears glasses for distance;  glasses not on face for evaluation Interpreter Services: N/A, no interpreter needed  Baseline status upon arrival: Patient Positioning: Upright in bed and also standing Lights: Natural lighting Distractions: None present  Safety/Restraints: N/A  Modifications made by clinician/staff: Patient Positioning: No modifications made Lights: No modifications made Distractions: No modifications made Restraints: N/A   Safety and Awareness   Behavior Observations: No concerns during session Awareness of Current Situation: Patient is aware of reason for admission and aware of limitations/injuries   Aware of falls precautions: Patient answered questions regarding falls precautions accurately (e.g., Is it safe for you to get up on your own? What do you do if you need something/in pain/need help?) Demonstrate use of call bell: Patient demonstrated how to use call bell, Patient verbally explained how and when to use call bell Implementation of safety/falls precautions: Patient is adhering to recall of safety precautions per observation and/or RN report   Objective Assessment   Respiratory Status: Room air  Oral Motor Examination:   Full examination completed with no noted abnormalities  Oral Health: Dentition: Natural  Motor Speech:  No concerns present; 100% intelligibility    Cognitive-Communication: Communication Observations: Verbally participates in assessment Orientation: Person, Date, Month, Year, Day, Place, and reason for admit Attention: Adequate for duration of session Command Following: 1-step commands: 5/5; two step commands 3/3 Yes/No Reliability: Simple questions: 5/5, Mildly complex questions: 3/3 with 1x self correction Verbal problem solving:  5/5 accuracy for solving functional problems (e.g., what would you do if you lost your wallet?) Reading Comprehension: Followed 5/5 written commands (e.g., circle the last word in  the last sentence on this page), Responded  to 4/4 questions from medicine label task on Assessment of Language Related Functional Activities (ALFA) Written Expression: Patient used right hand (dominant). Writing for sentences was characterized by appropriate letter formation, grammar, and syntax; 1x correction on spelling of 1 word with Pt identifying error when asked about word.  Pragmatics: Appropriate throughout evaluation Functional Recall: Patient independently able to recall functional information related to hospital course    Sections of the following standardized assessment(s) were administered:  Cognitive Linguistic Quick Test (CLQT) Story retelling subscore 1: 13/18 Story retelling comprehension: subscore 2: 3/3 Story Retelling: 8/10 - meets criterion    Informal assessment revealed the following:  Receptive Language  Comments  Functional Object Use Not impaired Pen, cup  Simple yes/no Not impaired 5/5  Mildly Complex yes/no Not impaired 3/3 with 1x self correction  Object Identification Not impaired 5/5  Simple Command Following Not impaired 5/5  Two step Command Following Not impaired 3/3  Understands Simple Conversation Not impaired   Understands Complex Conversation Not impaired Discussed admission reason and differential diagnoses  Expressive Language  Comments  Nonverbal (gestures) Not impaired   Automatic speech  Not assessed   Repetition Not impaired 2/2 sentence level  Open ended phrases Not impaired 2/2  Responsive naming Not impaired 2/2  Confrontation naming Not impaired 5/5  Conversational speech Not impaired  fluent      Awareness of Performance: Patient states performance is commensurate with baseline  Education    Patient Education was provided via verbal communication re: role of SLP, results of assessment and recommendations.  Patient verbally expressed/demonstrated understanding.  Barriers to patient education include: none. No further education  warranted at this time.    Assessment    ICD-10 code(s): R48.8 (Other symbolic dysfunction)  Patient presents with functional cognitive communication abilities in setting of admission for new onset transient left sided weakness which has since resolved.  Pt was verbal, fluent during conversation, was observed to generate appropriate questions and was able to follow commands.  Pt was attentive throughout assessment and was able to answer verbal problem solving, safety, and insight questions and perform recall tasks. Pt was able to verbally communicate wants and needs throughout session.  No further Speech Pathology intervention warranted at this time, therefore Speech to sign off.  Please reconsult if questions/concerns arise.  Prognosis:  Good for effective communication  Results and recommendations of this evaluation were communicated to: Nurse via face to face and First Call Provider via Epic chat   Plan  Frequency of treatment: Speech Pathology services are no longer warranted for the acute care setting for cognitive-communication intervention.  SLP to sign-off.  Please re-consult as needed.  Discharge Plan: No ongoing Speech Pathology needs after discharge   Thank you for this referral. Please page [phone] with questions.   Rannie Masters, MS, CCC-SLP

## 2024-06-09 NOTE — Care Plan (Signed)
  Problem: Cerebral insult: Includes TBI, TIA, CVA, SAH, Brain lesion/ tumor, ICH: Goal: Neurologic status will improve. Outcome: Progressing Note: No neuro deficits observed or reported by pt this shift.    Problem: Thrombocytopenia: Goal: Patient  will verbalize early recognition of complications    . Outcome: Progressing Note: Plt=71. No transfusion this shift. No s/s of active bleeding.    Problem: Anemia: Goal: Management  of Fatigue, Low Hct/Hgb Outcome: Progressing Note: Hgb=9.1. No transfusion this shift.    Problem: Neutropenia: Goal: Patient  will verbalize early recognition of complications    . Outcome: Progressing Note: WBC=0.8. Pt intermittently tachycardic this shift but other VSS. Provider notified of VS per order parameters.    Problem: Chemotherapy Induced Nausea and Vomiting: Goal: Management of Nausea/Vomiting Outcome: Progressing Note: PRN zofran  given x2 this shift with good relief per pt.

## 2024-06-10 NOTE — Procedures (Signed)
 Unsuccessful Diagnostic Lumbar Puncture attempt:  Procedure: Diagnostic Lumbar Puncture  Date Performed: 06/10/2024  Time Performed:  1245  Procedure Performed:  The indications for and risks of lumbar puncture were explained to the patient and informed consent was obtained.  Dedra Grounds, NP was present to assist and performed a timeout.  1mg  PO ativan and 0.5 mg IV dilaudid pre-medication was given prior to bone marrow biopsy obtained immediately prior to LP.  The patient was placed in the **left lateral decubitus* position and prepped with Betadine. 1% lidocaine  was used for local anesthesia. The L4-L5 interspace was entered with a 22 gauge spinal needle.  Though patient experienced pain/tingling down his leg, unable to obtain CSF flow despite attempts by myself and Dedra Grounds, NP.    Diagnostic lumbar puncture will be completed in IR tomorrow, 7/30, under imaging guidance. IR is aware of additional studies that will be needed (already ordered).   Estimated blood loss was 0.5 ml. The patient was advised to lay flat for one hour after completed of procedure. The patient was monitored by the care nurse post-procedure as needed.     Attestation Statement:   I personally performed the service, non-incident to. [facility])   ABIGAIL JOHNSTON PRESIDENT, NP   [phone]
